# Patient Record
Sex: Male | Born: 1945
Health system: Southern US, Community
[De-identification: ages and names within clinical notes are randomized; demographics above are authoritative.]

## PROBLEM LIST (undated history)

## (undated) DIAGNOSIS — C4491 Basal cell carcinoma of skin, unspecified: Secondary | ICD-10-CM

## (undated) DIAGNOSIS — L509 Urticaria, unspecified: Secondary | ICD-10-CM

## (undated) DIAGNOSIS — H9190 Unspecified hearing loss, unspecified ear: Secondary | ICD-10-CM

## (undated) DIAGNOSIS — M1990 Unspecified osteoarthritis, unspecified site: Secondary | ICD-10-CM

## (undated) DIAGNOSIS — R0683 Snoring: Secondary | ICD-10-CM

## (undated) DIAGNOSIS — C801 Malignant (primary) neoplasm, unspecified: Secondary | ICD-10-CM

## (undated) HISTORY — PX: COLONOSCOPY W/ POLYPECTOMY: SHX1380

## (undated) HISTORY — PX: SKIN CANCER EXCISION: SHX779

---

## 2004-02-27 ENCOUNTER — Ambulatory Visit (HOSPITAL_COMMUNITY): Admission: RE | Admit: 2004-02-27 | Discharge: 2004-02-27 | Payer: Self-pay | Admitting: Gastroenterology

## 2011-05-22 ENCOUNTER — Ambulatory Visit
Admission: RE | Admit: 2011-05-22 | Discharge: 2011-05-22 | Disposition: A | Discharge status: Home / Self Care | Payer: Medicare Other | Source: Ambulatory Visit | Attending: Family Medicine | Admitting: Family Medicine

## 2011-05-22 ENCOUNTER — Other Ambulatory Visit: Payer: Self-pay | Admitting: Family Medicine

## 2011-05-22 DIAGNOSIS — S22009A Unspecified fracture of unspecified thoracic vertebra, initial encounter for closed fracture: Secondary | ICD-10-CM

## 2012-01-07 ENCOUNTER — Other Ambulatory Visit: Payer: Self-pay | Admitting: Dermatology

## 2012-06-27 ENCOUNTER — Ambulatory Visit: Payer: Medicare Other | Attending: Family Medicine

## 2012-06-27 DIAGNOSIS — M25569 Pain in unspecified knee: Secondary | ICD-10-CM | POA: Insufficient documentation

## 2012-06-27 DIAGNOSIS — R5381 Other malaise: Secondary | ICD-10-CM | POA: Insufficient documentation

## 2012-06-27 DIAGNOSIS — M2560 Stiffness of unspecified joint, not elsewhere classified: Secondary | ICD-10-CM | POA: Insufficient documentation

## 2012-06-27 DIAGNOSIS — IMO0001 Reserved for inherently not codable concepts without codable children: Secondary | ICD-10-CM | POA: Insufficient documentation

## 2012-07-04 ENCOUNTER — Ambulatory Visit: Payer: Medicare Other

## 2012-07-11 ENCOUNTER — Ambulatory Visit: Payer: Medicare Other | Admitting: Physical Therapy

## 2012-07-18 ENCOUNTER — Ambulatory Visit: Payer: Medicare Other | Admitting: Physical Therapy

## 2014-03-29 ENCOUNTER — Other Ambulatory Visit: Payer: Self-pay | Admitting: Dermatology

## 2014-08-08 ENCOUNTER — Ambulatory Visit
Admission: RE | Admit: 2014-08-08 | Discharge: 2014-08-08 | Disposition: A | Discharge status: Home / Self Care | Payer: Medicare Other | Source: Ambulatory Visit | Attending: Family Medicine | Admitting: Family Medicine

## 2014-08-08 ENCOUNTER — Other Ambulatory Visit: Payer: Self-pay | Admitting: Family Medicine

## 2014-08-08 DIAGNOSIS — Z01818 Encounter for other preprocedural examination: Secondary | ICD-10-CM

## 2014-10-08 ENCOUNTER — Other Ambulatory Visit: Payer: Self-pay | Admitting: Orthopedic Surgery

## 2014-10-18 ENCOUNTER — Inpatient Hospital Stay (HOSPITAL_COMMUNITY): Admission: RE | Admit: 2014-10-18 | Payer: Medicare Other | Source: Ambulatory Visit

## 2014-10-23 ENCOUNTER — Encounter (HOSPITAL_COMMUNITY): Payer: Self-pay

## 2014-10-23 ENCOUNTER — Encounter (HOSPITAL_COMMUNITY)
Admission: RE | Admit: 2014-10-23 | Discharge: 2014-10-23 | Disposition: A | Discharge status: Home / Self Care | Payer: Medicare Other | Source: Ambulatory Visit | Attending: Orthopedic Surgery | Admitting: Orthopedic Surgery

## 2014-10-23 DIAGNOSIS — Z01812 Encounter for preprocedural laboratory examination: Secondary | ICD-10-CM | POA: Insufficient documentation

## 2014-10-23 HISTORY — DX: Unspecified hearing loss, unspecified ear: H91.90

## 2014-10-23 HISTORY — DX: Unspecified osteoarthritis, unspecified site: M19.90

## 2014-10-23 HISTORY — DX: Urticaria, unspecified: L50.9

## 2014-10-23 HISTORY — DX: Basal cell carcinoma of skin, unspecified: C44.91

## 2014-10-23 HISTORY — DX: Malignant (primary) neoplasm, unspecified: C80.1

## 2014-10-23 HISTORY — DX: Snoring: R06.83

## 2014-10-23 LAB — CBC WITH DIFFERENTIAL/PLATELET
Basophils Absolute: 0 10*3/uL (ref 0.0–0.1)
Basophils Relative: 0 % (ref 0–1)
EOS ABS: 0.1 10*3/uL (ref 0.0–0.7)
Eosinophils Relative: 2 % (ref 0–5)
HCT: 45.1 % (ref 39.0–52.0)
Hemoglobin: 15.1 g/dL (ref 13.0–17.0)
LYMPHS ABS: 2.6 10*3/uL (ref 0.7–4.0)
Lymphocytes Relative: 38 % (ref 12–46)
MCH: 31.4 pg (ref 26.0–34.0)
MCHC: 33.5 g/dL (ref 30.0–36.0)
MCV: 93.8 fL (ref 78.0–100.0)
MONO ABS: 0.4 10*3/uL (ref 0.1–1.0)
MONOS PCT: 6 % (ref 3–12)
Neutro Abs: 3.8 10*3/uL (ref 1.7–7.7)
Neutrophils Relative %: 54 % (ref 43–77)
Platelets: 224 10*3/uL (ref 150–400)
RBC: 4.81 MIL/uL (ref 4.22–5.81)
RDW: 14.1 % (ref 11.5–15.5)
WBC: 6.9 10*3/uL (ref 4.0–10.5)

## 2014-10-23 LAB — COMPREHENSIVE METABOLIC PANEL
ALBUMIN: 4.3 g/dL (ref 3.5–5.2)
ALT: 20 U/L (ref 0–53)
AST: 19 U/L (ref 0–37)
Alkaline Phosphatase: 73 U/L (ref 39–117)
Anion gap: 7 (ref 5–15)
BUN: 11 mg/dL (ref 6–23)
CO2: 28 mmol/L (ref 19–32)
Calcium: 9.1 mg/dL (ref 8.4–10.5)
Chloride: 106 mmol/L (ref 96–112)
Creatinine, Ser: 0.94 mg/dL (ref 0.50–1.35)
GFR calc Af Amer: >90 mL/min (ref >90)
GFR, EST NON AFRICAN AMERICAN: 83 mL/min — AB (ref >90)
Glucose, Bld: 107 mg/dL — ABNORMAL HIGH (ref 70–99)
Potassium: 4.1 mmol/L (ref 3.5–5.1)
SODIUM: 141 mmol/L (ref 135–145)
TOTAL PROTEIN: 7.1 g/dL (ref 6.0–8.3)
Total Bilirubin: 0.9 mg/dL (ref 0.3–1.2)

## 2014-10-23 LAB — URINALYSIS, ROUTINE W REFLEX MICROSCOPIC
Bilirubin Urine: NEGATIVE
Glucose, UA: NEGATIVE mg/dL
Hgb urine dipstick: NEGATIVE
KETONES UR: NEGATIVE mg/dL
LEUKOCYTES UA: NEGATIVE
Nitrite: NEGATIVE
Protein, ur: NEGATIVE mg/dL
SPECIFIC GRAVITY, URINE: 1.007 (ref 1.005–1.030)
Urobilinogen, UA: 0.2 mg/dL (ref 0.0–1.0)
pH: 6.5 (ref 5.0–8.0)

## 2014-10-23 LAB — PROTIME-INR
INR: 1.01 (ref 0.00–1.49)
Prothrombin Time: 13.4 seconds (ref 11.6–15.2)

## 2014-10-23 LAB — SURGICAL PCR SCREEN
MRSA, PCR: NEGATIVE
Staphylococcus aureus: POSITIVE — AB

## 2014-10-23 LAB — APTT: aPTT: 31 seconds (ref 24–37)

## 2014-10-23 NOTE — Progress Notes (Signed)
PCP is Dibas Koirala. Patient denied having any cardiac or pulmonary issues.   Patient informed Nurse that he had a EKG done in January with PCP. Nurse did not see EKG in EPIC. Will request EKG from PCP.

## 2014-10-23 NOTE — Pre-Procedure Instructions (Signed)
JACCOB CZAPLICKI  10/23/2014   Your procedure is scheduled on:  Monday October 29, 2014 at 9:50 AM.  Report to [facility] Admitting at 7:50 AM.  Call this number if you have problems the morning of surgery: [phone]   Remember:   Do not eat food or drink liquids after midnight.   Take these medicines the morning of surgery with A SIP OF WATER: Loratadine (Claritin)   Please stop taking any vitamins, fish oil, herbal medications, Ibuprofen, Advil, Motrin, Alleve, etc   Do not wear jewelry.  Do not wear lotions, powders, or cologne.   Men may shave face and neck.  Do not bring valuables to the hospital.  [facility] is not responsible for any belongings or valuables.               Contacts, dentures or bridgework may not be worn into surgery.  Leave suitcase in the car. After surgery it may be brought to your room.  For patients admitted to the hospital, discharge time is determined by your treatment team.               Patients discharged the day of surgery will not be allowed to drive home.  Name and phone number of your driver:   Special Instructions: Shower using CHG soap the night before and the morning of your surgery   Please read over the following fact sheets that you were given: Pain Booklet, Coughing and Deep Breathing, Total Joint Packet, MRSA Information and Surgical Site Infection Prevention

## 2014-10-24 LAB — URINE CULTURE
COLONY COUNT: NO GROWTH
CULTURE: NO GROWTH

## 2014-10-26 MED ORDER — TRANEXAMIC ACID 100 MG/ML IV SOLN
1000.0000 mg | INTRAVENOUS | Status: DC
  Filled 2014-10-26: qty 10

## 2014-10-26 MED ORDER — BUPIVACAINE LIPOSOME 1.3 % IJ SUSP
20.0000 mL | Freq: Once | INTRAMUSCULAR | Status: AC
  Administered 2014-10-29: 20 mL
  Filled 2014-10-26: qty 20

## 2014-10-28 MED ORDER — CEFAZOLIN SODIUM-DEXTROSE 2-3 GM-% IV SOLR: 2.0000 g | INTRAVENOUS | Status: DC

## 2014-10-29 ENCOUNTER — Encounter (HOSPITAL_COMMUNITY)
Admission: RE | Disposition: A | Discharge status: Home Health | Payer: Self-pay | Source: Ambulatory Visit | Attending: Orthopedic Surgery

## 2014-10-29 ENCOUNTER — Encounter (HOSPITAL_COMMUNITY): Payer: Self-pay

## 2014-10-29 ENCOUNTER — Inpatient Hospital Stay (HOSPITAL_COMMUNITY): Payer: Medicare Other | Admitting: Anesthesiology

## 2014-10-29 ENCOUNTER — Inpatient Hospital Stay (HOSPITAL_COMMUNITY)
Admission: RE | Admit: 2014-10-29 | Discharge: 2014-10-30 | DRG: 470 | Disposition: A | Discharge status: Home Health | Payer: Medicare Other | Source: Ambulatory Visit | Attending: Orthopedic Surgery | Admitting: Orthopedic Surgery

## 2014-10-29 DIAGNOSIS — Z7901 Long term (current) use of anticoagulants: Secondary | ICD-10-CM | POA: Diagnosis not present

## 2014-10-29 DIAGNOSIS — D62 Acute posthemorrhagic anemia: Secondary | ICD-10-CM | POA: Diagnosis not present

## 2014-10-29 DIAGNOSIS — Z881 Allergy status to other antibiotic agents status: Secondary | ICD-10-CM

## 2014-10-29 DIAGNOSIS — M1711 Unilateral primary osteoarthritis, right knee: Secondary | ICD-10-CM | POA: Diagnosis present

## 2014-10-29 DIAGNOSIS — M25561 Pain in right knee: Secondary | ICD-10-CM | POA: Diagnosis present

## 2014-10-29 DIAGNOSIS — Z79899 Other long term (current) drug therapy: Secondary | ICD-10-CM

## 2014-10-29 DIAGNOSIS — Z96659 Presence of unspecified artificial knee joint: Secondary | ICD-10-CM

## 2014-10-29 HISTORY — PX: TOTAL KNEE ARTHROPLASTY: SHX125

## 2014-10-29 LAB — CBC
HEMATOCRIT: 44.6 % (ref 39.0–52.0)
Hemoglobin: 14.7 g/dL (ref 13.0–17.0)
MCH: 30.9 pg (ref 26.0–34.0)
MCHC: 33 g/dL (ref 30.0–36.0)
MCV: 93.9 fL (ref 78.0–100.0)
Platelets: 188 10*3/uL (ref 150–400)
RBC: 4.75 MIL/uL (ref 4.22–5.81)
RDW: 13.9 % (ref 11.5–15.5)
WBC: 14.1 10*3/uL — ABNORMAL HIGH (ref 4.0–10.5)

## 2014-10-29 LAB — CREATININE, SERUM
CREATININE: 0.99 mg/dL (ref 0.50–1.35)
GFR calc Af Amer: >90 mL/min (ref >90)
GFR calc non Af Amer: 81 mL/min — ABNORMAL LOW (ref >90)

## 2014-10-29 SURGERY — ARTHROPLASTY, KNEE, TOTAL
Anesthesia: General | Site: Knee | Laterality: Right

## 2014-10-29 MED ORDER — METHOCARBAMOL 500 MG PO TABS
ORAL_TABLET | ORAL | Status: AC
  Filled 2014-10-29: qty 1

## 2014-10-29 MED ORDER — VANCOMYCIN HCL IN DEXTROSE 1-5 GM/200ML-% IV SOLN
1000.0000 mg | INTRAVENOUS | Status: AC
  Administered 2014-10-29: 1000 mg via INTRAVENOUS
  Filled 2014-10-29 (×2): qty 200

## 2014-10-29 MED ORDER — HYDROMORPHONE HCL 1 MG/ML IJ SOLN
INTRAMUSCULAR | Status: AC
  Filled 2014-10-29: qty 1

## 2014-10-29 MED ORDER — PROPOFOL 10 MG/ML IV BOLUS
INTRAVENOUS | Status: DC | PRN
  Administered 2014-10-29: 200 mg via INTRAVENOUS

## 2014-10-29 MED ORDER — ZOLPIDEM TARTRATE 5 MG PO TABS
5.0000 mg | ORAL_TABLET | Freq: Every evening | ORAL | Status: DC | PRN
Start: 2014-10-29 — End: 2014-10-30

## 2014-10-29 MED ORDER — SODIUM CHLORIDE 0.9 % IR SOLN
Status: DC | PRN
  Administered 2014-10-29: 1000 mL

## 2014-10-29 MED ORDER — LACTATED RINGERS IV SOLN
INTRAVENOUS | Status: DC
  Administered 2014-10-29: 10:00:00 via INTRAVENOUS

## 2014-10-29 MED ORDER — SODIUM CHLORIDE 0.9 % IV SOLN
INTRAVENOUS | Status: DC
  Administered 2014-10-29: 17:00:00 via INTRAVENOUS

## 2014-10-29 MED ORDER — VANCOMYCIN HCL IN DEXTROSE 1-5 GM/200ML-% IV SOLN
1000.0000 mg | Freq: Two times a day (BID) | INTRAVENOUS | Status: AC
  Administered 2014-10-29: 1000 mg via INTRAVENOUS
  Filled 2014-10-29 (×2): qty 200

## 2014-10-29 MED ORDER — METOCLOPRAMIDE HCL 5 MG/ML IJ SOLN: 5.0000 mg | Freq: Three times a day (TID) | INTRAMUSCULAR | Status: DC | PRN

## 2014-10-29 MED ORDER — METHOCARBAMOL 1000 MG/10ML IJ SOLN
500.0000 mg | Freq: Four times a day (QID) | INTRAVENOUS | Status: DC | PRN
  Filled 2014-10-29: qty 5

## 2014-10-29 MED ORDER — BUPIVACAINE-EPINEPHRINE 0.5% -1:200000 IJ SOLN
INTRAMUSCULAR | Status: DC | PRN
  Administered 2014-10-29: 20 mL

## 2014-10-29 MED ORDER — ONDANSETRON HCL 4 MG/2ML IJ SOLN: 4.0000 mg | Freq: Four times a day (QID) | INTRAMUSCULAR | Status: DC | PRN

## 2014-10-29 MED ORDER — SENNOSIDES-DOCUSATE SODIUM 8.6-50 MG PO TABS: 1.0000 | ORAL_TABLET | Freq: Every evening | ORAL | Status: DC | PRN

## 2014-10-29 MED ORDER — SODIUM CHLORIDE 0.9 % IV SOLN: INTRAVENOUS | Status: DC

## 2014-10-29 MED ORDER — CHLORHEXIDINE GLUCONATE 4 % EX LIQD
60.0000 mL | Freq: Once | CUTANEOUS | Status: DC
  Filled 2014-10-29: qty 60

## 2014-10-29 MED ORDER — ROCURONIUM BROMIDE 50 MG/5ML IV SOLN
INTRAVENOUS | Status: AC
  Filled 2014-10-29: qty 1

## 2014-10-29 MED ORDER — FENTANYL CITRATE 0.05 MG/ML IJ SOLN
INTRAMUSCULAR | Status: DC | PRN
  Administered 2014-10-29 (×5): 50 ug via INTRAVENOUS

## 2014-10-29 MED ORDER — DOCUSATE SODIUM 100 MG PO CAPS
100.0000 mg | ORAL_CAPSULE | Freq: Two times a day (BID) | ORAL | Status: DC
  Administered 2014-10-29 – 2014-10-30 (×2): 100 mg via ORAL
  Filled 2014-10-29 (×3): qty 1

## 2014-10-29 MED ORDER — HYDROMORPHONE HCL 1 MG/ML IJ SOLN
1.0000 mg | INTRAMUSCULAR | Status: DC | PRN
  Administered 2014-10-29 – 2014-10-30 (×2): 1 mg via INTRAVENOUS
  Filled 2014-10-29 (×2): qty 1

## 2014-10-29 MED ORDER — TRANEXAMIC ACID 100 MG/ML IV SOLN
2000.0000 mg | INTRAVENOUS | Status: AC
  Administered 2014-10-29: 2000 mg via TOPICAL
  Filled 2014-10-29: qty 20

## 2014-10-29 MED ORDER — PROPOFOL 10 MG/ML IV BOLUS
INTRAVENOUS | Status: AC
  Filled 2014-10-29: qty 20

## 2014-10-29 MED ORDER — ALUM & MAG HYDROXIDE-SIMETH 200-200-20 MG/5ML PO SUSP: 30.0000 mL | ORAL | Status: DC | PRN

## 2014-10-29 MED ORDER — LACTATED RINGERS IV SOLN
INTRAVENOUS | Status: DC | PRN
  Administered 2014-10-29 (×2): via INTRAVENOUS

## 2014-10-29 MED ORDER — LIDOCAINE HCL (CARDIAC) 20 MG/ML IV SOLN
INTRAVENOUS | Status: AC
  Filled 2014-10-29: qty 10

## 2014-10-29 MED ORDER — PHENOL 1.4 % MT LIQD: 1.0000 | OROMUCOSAL | Status: DC | PRN

## 2014-10-29 MED ORDER — FENTANYL CITRATE 0.05 MG/ML IJ SOLN
INTRAMUSCULAR | Status: AC
Start: 2014-10-29 — End: 2014-10-29
  Filled 2014-10-29: qty 5

## 2014-10-29 MED ORDER — LIDOCAINE HCL (CARDIAC) 20 MG/ML IV SOLN
INTRAVENOUS | Status: DC | PRN
  Administered 2014-10-29: 80 mg via INTRAVENOUS

## 2014-10-29 MED ORDER — BISACODYL 5 MG PO TBEC: 5.0000 mg | DELAYED_RELEASE_TABLET | Freq: Every day | ORAL | Status: DC | PRN

## 2014-10-29 MED ORDER — MIDAZOLAM HCL 2 MG/2ML IJ SOLN
INTRAMUSCULAR | Status: AC
  Filled 2014-10-29: qty 2

## 2014-10-29 MED ORDER — ACETAMINOPHEN 650 MG RE SUPP: 650.0000 mg | Freq: Four times a day (QID) | RECTAL | Status: DC | PRN

## 2014-10-29 MED ORDER — METHOCARBAMOL 500 MG PO TABS
500.0000 mg | ORAL_TABLET | Freq: Four times a day (QID) | ORAL | Status: DC | PRN
  Administered 2014-10-29 – 2014-10-30 (×5): 500 mg via ORAL
  Filled 2014-10-29 (×4): qty 1

## 2014-10-29 MED ORDER — OXYCODONE HCL 5 MG PO TABS
5.0000 mg | ORAL_TABLET | ORAL | Status: DC | PRN
  Administered 2014-10-29 – 2014-10-30 (×7): 10 mg via ORAL
  Filled 2014-10-29 (×7): qty 2

## 2014-10-29 MED ORDER — FENTANYL CITRATE 0.05 MG/ML IJ SOLN
INTRAMUSCULAR | Status: AC
  Filled 2014-10-29: qty 2

## 2014-10-29 MED ORDER — ENOXAPARIN SODIUM 30 MG/0.3ML ~~LOC~~ SOLN
30.0000 mg | Freq: Two times a day (BID) | SUBCUTANEOUS | Status: DC
  Administered 2014-10-30: 30 mg via SUBCUTANEOUS
  Filled 2014-10-29: qty 0.3

## 2014-10-29 MED ORDER — CELECOXIB 200 MG PO CAPS
200.0000 mg | ORAL_CAPSULE | Freq: Two times a day (BID) | ORAL | Status: DC
  Administered 2014-10-29 – 2014-10-30 (×2): 200 mg via ORAL
  Filled 2014-10-29 (×3): qty 1

## 2014-10-29 MED ORDER — MEPERIDINE HCL 25 MG/ML IJ SOLN
6.2500 mg | INTRAMUSCULAR | Status: DC | PRN
  Administered 2014-10-29: 12.5 mg via INTRAVENOUS

## 2014-10-29 MED ORDER — METOCLOPRAMIDE HCL 5 MG PO TABS: 5.0000 mg | ORAL_TABLET | Freq: Three times a day (TID) | ORAL | Status: DC | PRN

## 2014-10-29 MED ORDER — DIPHENHYDRAMINE HCL 12.5 MG/5ML PO ELIX: 12.5000 mg | ORAL_SOLUTION | ORAL | Status: DC | PRN

## 2014-10-29 MED ORDER — HYDROMORPHONE HCL 1 MG/ML IJ SOLN
0.2500 mg | INTRAMUSCULAR | Status: DC | PRN
  Administered 2014-10-29 (×4): 0.5 mg via INTRAVENOUS

## 2014-10-29 MED ORDER — LORATADINE 10 MG PO TABS
10.0000 mg | ORAL_TABLET | Freq: Two times a day (BID) | ORAL | Status: DC
  Administered 2014-10-29 – 2014-10-30 (×2): 10 mg via ORAL
  Filled 2014-10-29 (×3): qty 1

## 2014-10-29 MED ORDER — OXYCODONE HCL ER 10 MG PO T12A
10.0000 mg | EXTENDED_RELEASE_TABLET | Freq: Two times a day (BID) | ORAL | Status: DC
  Administered 2014-10-29 – 2014-10-30 (×2): 10 mg via ORAL
  Filled 2014-10-29 (×3): qty 1

## 2014-10-29 MED ORDER — MIDAZOLAM HCL 5 MG/5ML IJ SOLN
INTRAMUSCULAR | Status: DC | PRN
  Administered 2014-10-29: 2 mg via INTRAVENOUS

## 2014-10-29 MED ORDER — ONDANSETRON HCL 4 MG/2ML IJ SOLN: 4.0000 mg | Freq: Once | INTRAMUSCULAR | Status: DC | PRN

## 2014-10-29 MED ORDER — ACETAMINOPHEN 325 MG PO TABS: 650.0000 mg | ORAL_TABLET | Freq: Four times a day (QID) | ORAL | Status: DC | PRN

## 2014-10-29 MED ORDER — MENTHOL 3 MG MT LOZG: 1.0000 | LOZENGE | OROMUCOSAL | Status: DC | PRN

## 2014-10-29 MED ORDER — MEPERIDINE HCL 25 MG/ML IJ SOLN
INTRAMUSCULAR | Status: AC
  Filled 2014-10-29: qty 1

## 2014-10-29 MED ORDER — ONDANSETRON HCL 4 MG/2ML IJ SOLN
INTRAMUSCULAR | Status: DC | PRN
  Administered 2014-10-29: 4 mg via INTRAVENOUS

## 2014-10-29 MED ORDER — ONDANSETRON HCL 4 MG PO TABS: 4.0000 mg | ORAL_TABLET | Freq: Four times a day (QID) | ORAL | Status: DC | PRN

## 2014-10-29 MED ORDER — FLEET ENEMA 7-19 GM/118ML RE ENEM: 1.0000 | ENEMA | Freq: Once | RECTAL | Status: AC | PRN

## 2014-10-29 SURGICAL SUPPLY — 59 items
BANDAGE ELASTIC 6 VELCRO ST LF (GAUZE/BANDAGES/DRESSINGS) ×3 IMPLANT
BANDAGE ESMARK 6X9 LF (GAUZE/BANDAGES/DRESSINGS) ×1 IMPLANT
BLADE SAGITTAL 13X1.27X60 (BLADE) ×2 IMPLANT
BLADE SAGITTAL 13X1.27X60MM (BLADE) ×1
BLADE SAW SGTL 83.5X18.5 (BLADE) ×3 IMPLANT
BLADE SURG 10 STRL SS (BLADE) ×3 IMPLANT
BNDG ESMARK 6X9 LF (GAUZE/BANDAGES/DRESSINGS) ×3
BOWL SMART MIX CTS (DISPOSABLE) ×3 IMPLANT
CAPT KNEE TOTAL 3 ×3 IMPLANT
CEMENT BONE SIMPLEX SPEEDSET (Cement) ×6 IMPLANT
COVER SURGICAL LIGHT HANDLE (MISCELLANEOUS) ×3 IMPLANT
CUFF TOURNIQUET SINGLE 34IN LL (TOURNIQUET CUFF) ×3 IMPLANT
DRAPE EXTREMITY T 121X128X90 (DRAPE) ×3 IMPLANT
DRAPE IMP U-DRAPE 54X76 (DRAPES) ×3 IMPLANT
DRAPE INCISE IOBAN 66X45 STRL (DRAPES) ×6 IMPLANT
DRAPE PROXIMA HALF (DRAPES) IMPLANT
DRAPE U-SHAPE 47X51 STRL (DRAPES) ×3 IMPLANT
DRSG ADAPTIC 3X8 NADH LF (GAUZE/BANDAGES/DRESSINGS) ×3 IMPLANT
DRSG PAD ABDOMINAL 8X10 ST (GAUZE/BANDAGES/DRESSINGS) ×3 IMPLANT
DURAPREP 26ML APPLICATOR (WOUND CARE) ×6 IMPLANT
ELECT REM PT RETURN 9FT ADLT (ELECTROSURGICAL) ×3
ELECTRODE REM PT RTRN 9FT ADLT (ELECTROSURGICAL) ×1 IMPLANT
GAUZE SPONGE 4X4 12PLY STRL (GAUZE/BANDAGES/DRESSINGS) ×3 IMPLANT
GLOVE BIOGEL M 7.0 STRL (GLOVE) IMPLANT
GLOVE BIOGEL PI IND STRL 7.5 (GLOVE) IMPLANT
GLOVE BIOGEL PI IND STRL 8.5 (GLOVE) ×2 IMPLANT
GLOVE BIOGEL PI INDICATOR 7.5 (GLOVE)
GLOVE BIOGEL PI INDICATOR 8.5 (GLOVE) ×4
GLOVE SURG ORTHO 8.0 STRL STRW (GLOVE) ×6 IMPLANT
GOWN STRL REUS W/ TWL LRG LVL3 (GOWN DISPOSABLE) ×1 IMPLANT
GOWN STRL REUS W/ TWL XL LVL3 (GOWN DISPOSABLE) ×2 IMPLANT
GOWN STRL REUS W/TWL LRG LVL3 (GOWN DISPOSABLE) ×2
GOWN STRL REUS W/TWL XL LVL3 (GOWN DISPOSABLE) ×4
HANDPIECE INTERPULSE COAX TIP (DISPOSABLE) ×2
HOOD PEEL AWAY FACE SHEILD DIS (HOOD) ×9 IMPLANT
KIT BASIN OR (CUSTOM PROCEDURE TRAY) ×3 IMPLANT
KIT ROOM TURNOVER OR (KITS) ×3 IMPLANT
KNEE CAPITATED TOTAL 3 ×1 IMPLANT
MANIFOLD NEPTUNE II (INSTRUMENTS) ×3 IMPLANT
NEEDLE 22X1 1/2 (OR ONLY) (NEEDLE) ×6 IMPLANT
NS IRRIG 1000ML POUR BTL (IV SOLUTION) ×3 IMPLANT
PACK TOTAL JOINT (CUSTOM PROCEDURE TRAY) ×3 IMPLANT
PACK UNIVERSAL I (CUSTOM PROCEDURE TRAY) ×3 IMPLANT
PAD ARMBOARD 7.5X6 YLW CONV (MISCELLANEOUS) ×6 IMPLANT
PADDING CAST COTTON 6X4 STRL (CAST SUPPLIES) ×3 IMPLANT
SET HNDPC FAN SPRY TIP SCT (DISPOSABLE) ×1 IMPLANT
SPONGE GAUZE 4X4 12PLY STER LF (GAUZE/BANDAGES/DRESSINGS) ×3 IMPLANT
STAPLER VISISTAT 35W (STAPLE) ×3 IMPLANT
SUCTION FRAZIER TIP 10 FR DISP (SUCTIONS) ×3 IMPLANT
SUT BONE WAX W31G (SUTURE) ×3 IMPLANT
SUT VIC AB 0 CTB1 27 (SUTURE) ×6 IMPLANT
SUT VIC AB 1 CT1 27 (SUTURE) ×4
SUT VIC AB 1 CT1 27XBRD ANBCTR (SUTURE) ×2 IMPLANT
SUT VIC AB 2-0 CT1 27 (SUTURE) ×4
SUT VIC AB 2-0 CT1 TAPERPNT 27 (SUTURE) ×2 IMPLANT
SYR 20CC LL (SYRINGE) ×6 IMPLANT
TOWEL OR 17X24 6PK STRL BLUE (TOWEL DISPOSABLE) ×3 IMPLANT
TOWEL OR 17X26 10 PK STRL BLUE (TOWEL DISPOSABLE) ×3 IMPLANT
WATER STERILE IRR 1000ML POUR (IV SOLUTION) ×6 IMPLANT

## 2014-10-29 NOTE — Progress Notes (Signed)
Utilization review completed.  

## 2014-10-29 NOTE — Progress Notes (Signed)
Call to Dr. Ronnie Derby, requested review of preop antibiotic due to allergies. Call to Pharmacy, spoke with Crystal, R.Ph, order clarified for Vancomycin.

## 2014-10-29 NOTE — Op Note (Signed)
TOTAL KNEE REPLACEMENT OPERATIVE NOTE:  10/29/2014  1:37 PM  PATIENT:  Angel Boyd  69 y.o. male  PRE-OPERATIVE DIAGNOSIS:  primary osteoarthritis right knee  POST-OPERATIVE DIAGNOSIS:  primary osteoarthritis right knee  PROCEDURE:  Procedure(s): TOTAL KNEE ARTHROPLASTY  SURGEON:  Surgeon(s): Vickey Huger, MD  PHYSICIAN ASSISTANT: [name], [facility]  ANESTHESIA:   general  DRAINS: Hemovac  SPECIMEN: None  COUNTS:  Correct  TOURNIQUET:   Total Tourniquet Time Documented: Thigh (Right) - -6 minutes Total: Thigh (Right) - -6 minutes   DICTATION:  Indication for procedure:    The patient is a 69 y.o. male who has failed conservative treatment for primary osteoarthritis right knee.  Informed consent was obtained prior to anesthesia. The risks versus benefits of the operation were explain and in a way the patient can, and did, understand.   On the implant demand matching protocol, this patient scored 10.  Therefore, this patient did" "did not receive a polyethylene insert with vitamin E which is a high demand implant.  Description of procedure:     The patient was taken to the operating room and placed under anesthesia.  The patient was positioned in the usual fashion taking care that all body parts were adequately padded and/or protected.  I foley catheter was not placed.  A tourniquet was applied and the leg prepped and draped in the usual sterile fashion.  The extremity was exsanguinated with the esmarch and tourniquet inflated to 350 mmHg.  Pre-operative range of motion was normal.  The knee was in 5 degree of mild varus.  A midline incision approximately 6-7 inches long was made with a #10 blade.  A new blade was used to make a parapatellar arthrotomy going 2-3 cm into the quadriceps tendon, over the patella, and alongside the medial aspect of the patellar tendon.  A synovectomy was then performed with the #10 blade and forceps. I then elevated the deep MCL off the medial  tibial metaphysis subperiosteally around to the semimembranosus attachment.    I everted the patella and used calipers to measure patellar thickness.  I used the reamer to ream down to appropriate thickness to recreate the native thickness.  I then removed excess bone with the rongeur and sagittal saw.  I used the appropriately sized template and drilled the three lug holes.  I then put the trial in place and measured the thickness with the calipers to ensure recreation of the native thickness.  The trial was then removed and the patella subluxed and the knee brought into flexion.  A homan retractor was place to retract and protect the patella and lateral structures.  A Z-retractor was place medially to protect the medial structures.  The extra-medullary alignment system was used to make cut the tibial articular surface perpendicular to the anamotic axis of the tibia and in 3 degrees of posterior slope.  The cut surface and alignment jig was removed.  I then used the intramedullary alignment guide to make a 3 valgus cut on the distal femur.  I then marked out the epicondylar axis on the distal femur.  The posterior condylar axis measured 3 degrees.  I then used the anterior referencing sizer and measured the femur to be a size 10.  The 4-In-1 cutting block was screwed into place in external rotation matching the posterior condylar angle, making our cuts perpendicular to the epicondylar axis.  Anterior, posterior and chamfer cuts were made with the sagittal saw.  The cutting block and cut pieces  were removed.  A lamina spreader was placed in 90 degrees of flexion.  The ACL, PCL, menisci, and posterior condylar osteophytes were removed.  A 12 mm spacer blocked was found to offer good flexion and extension gap balance after minimal in degree releasing.   The scoop retractor was then placed and the femoral finishing block was pinned in place.  The small sagittal saw was used as well as the lug drill to finish  the femur.  The block and cut surfaces were removed and the medullary canal hole filled with autograft bone from the cut pieces.  The tibia was delivered forward in deep flexion and external rotation.  A size F tray was selected and pinned into place centered on the medial 1/3 of the tibial tubercle.  The reamer and keel was used to prepare the tibia through the tray.    I then trialed with the size 10 femur, size F tibia, a 12 mm insert and the 35 patella.  I had excellent flexion/extension gap balance, excellent patella tracking.  Flexion was full and beyond 120 degrees; extension was zero.  These components were chosen and the staff opened them to me on the back table while the knee was lavaged copiously and the cement mixed.  The soft tissue was infiltrated with 60cc of exparel 1.3% through a 21 gauge needle.  I cemented in the components and removed all excess cement.  The polyethylene tibial component was snapped into place and the knee placed in extension while cement was hardening.  The capsule was infilltrated with 30cc of .25% Marcaine with epinephrine.  A hemovac was place in the joint exiting superolaterally.  A pain pump was place superomedially superficial to the arthrotomy.  Once the cement was hard, the tourniquet was let down.  Hemostasis was obtained.  The arthrotomy was closed with figure-8 #1 vicryl sutures.  The deep soft tissues were closed with #0 vicryls and the subcuticular layer closed with a running #2-0 vicryl.  The skin was reapproximated and closed with skin staples.  The wound was dressed with xeroform, 4 x4's, 2 ABD sponges, a single layer of webril and a TED stocking.   The patient was then awakened, extubated, and taken to the recovery room in stable condition.  BLOOD LOSS:  300cc DRAINS: 1 hemovac, 1 pain catheter COMPLICATIONS:  None.  PLAN OF CARE: Admit to inpatient   PATIENT DISPOSITION:  PACU - hemodynamically stable.   Delay start of Pharmacological VTE  agent (>24hrs) due to surgical blood loss or risk of bleeding:  not applicable  Please fax a copy of this op note to my office at [phone] (please only include page 1 and 2 of the Case Information op note)

## 2014-10-29 NOTE — H&P (Signed)
  Angel Boyd MRN:  [id number] DOB/SEX:  Oct 27, 1945/male  CHIEF COMPLAINT:  Painful right Knee  HISTORY: Patient is a 69 y.o. male presented with a history of pain in the right knee. Onset of symptoms was gradual starting several years ago with gradually worsening course since that time. Prior procedures on the knee include arthroscopy. Patient has been treated conservatively with over-the-counter NSAIDs and activity modification. Patient currently rates pain in the knee at 10 out of 10 with activity. There is pain at night.  PAST MEDICAL HISTORY: There are no active problems to display for this patient.  Past Medical History  Diagnosis Date  . Arthritis   . Cancer     skin cancer removed  . Urticaria   . Basal cell carcinoma   . Hearing loss   . Snoring    Past Surgical History  Procedure Laterality Date  . Skin cancer excision      neck and head  . Colonoscopy w/ polypectomy       MEDICATIONS:   No prescriptions prior to admission    ALLERGIES:   Allergies  Allergen Reactions  . Ampicillin Hives  . Azithromycin Hives, Itching and Swelling    REVIEW OF SYSTEMS:  A comprehensive review of systems was negative.   FAMILY HISTORY:  No family history on file.  SOCIAL HISTORY:   History  Substance Use Topics  . Smoking status: Never Smoker   . Smokeless tobacco: Not on file  . Alcohol Use: Yes     Comment: occasional     EXAMINATION:  Vital signs in last 24 hours:    General appearance: alert, cooperative and no distress Lungs: clear to auscultation bilaterally Heart: regular rate and rhythm, S1, S2 normal, no murmur, click, rub or gallop Abdomen: soft, non-tender; bowel sounds normal; no masses,  no organomegaly Extremities: extremities normal, atraumatic, no cyanosis or edema and Homans sign is negative, no sign of DVT Pulses: 2+ and symmetric Skin: Skin color, texture, turgor normal. No rashes or lesions Lymph nodes: Cervical, supraclavicular, and  axillary nodes normal.  Musculoskeletal:  ROM 0-110, Ligaments intact,  Imaging Review Plain radiographs demonstrate severe degenerative joint disease of the right knee. The overall alignment is significant varus. The bone quality appears to be good for age and reported activity level.  Assessment/Plan: Primary osteoarthritis, right knee   The patient history, physical examination and imaging studies are consistent with advanced degenerative joint disease of the right knee. The patient has failed conservative treatment.  The clearance notes were reviewed.  After discussion with the patient it was felt that Total Knee Replacement was indicated. The procedure,  risks, and benefits of total knee arthroplasty were presented and reviewed. The risks including but not limited to aseptic loosening, infection, blood clots, vascular injury, stiffness, patella tracking problems complications among others were discussed. The patient acknowledged the explanation, agreed to proceed with the plan.  Angel Boyd 10/29/2014, 6:46 AM

## 2014-10-29 NOTE — Transfer of Care (Signed)
Immediate Anesthesia Transfer of Care Note  Patient: Angel Boyd  Procedure(s) Performed: Procedure(s): TOTAL KNEE ARTHROPLASTY (Right)  Patient Location: PACU  Anesthesia Type:General  Level of Consciousness: awake and alert   Airway & Oxygen Therapy: Patient Spontanous Breathing and Patient connected to nasal cannula oxygen  Post-op Assessment: Report given to RN and Post -op Vital signs reviewed and stable  Post vital signs: stable  Last Vitals:  Filed Vitals:   10/29/14 1214  BP:   Pulse:   Temp: 36.5 C  Resp:     Complications: No apparent anesthesia complications

## 2014-10-29 NOTE — Progress Notes (Signed)
Orthopedic Tech Progress Note Patient Details:  Angel Boyd [birth year] [id number]  CPM Right Knee CPM Right Knee: On Right Knee Flexion (Degrees): 90 Right Knee Extension (Degrees): 0 Additional Comments: trapeze bar patient helper Viewed order from doctor's order list; ortho currently has no footsie roll but will provide it for pt when obtained  Hildred Priest 10/29/2014, 1:35 PM

## 2014-10-29 NOTE — Anesthesia Postprocedure Evaluation (Signed)
  Anesthesia Post-op Note  Patient: Angel Boyd  Procedure(s) Performed: Procedure(s): TOTAL KNEE ARTHROPLASTY (Right)  Patient Location: PACU  Anesthesia Type: No value filed.   Level of Consciousness: awake, alert  and oriented  Airway and Oxygen Therapy: Patient Spontanous Breathing  Post-op Pain: mild  Post-op Assessment: Post-op Vital signs reviewed  Post-op Vital Signs: Reviewed  Last Vitals:  Filed Vitals:   10/29/14 1400  BP: 145/72  Pulse: 60  Temp:   Resp: 13    Complications: No apparent anesthesia complications

## 2014-10-29 NOTE — Evaluation (Signed)
Physical Therapy Evaluation Patient Details Name: Angel Boyd MRN: [id number] DOB: 03-20-1946 Today's Date: 10/29/2014   History of Present Illness  69 y.o. male admitted to [facility] on 10/29/14 for elective R TKA.  Pt with no significant PMHx.   Clinical Impression  Pt is POD #0 and is moving well, although he is self limiting his WB through his right foot and essentially walked in room with TDWB R.  He may benefit from doing a round of knee presses and exercises before he gets up out of bed next time to decrease stiffness and increase extension in standing.  I anticipate he will do well enough to return home with wife's supervision and HHPT at discharge.   PT to follow acutely for deficits listed below.       Follow Up Recommendations Home health PT;Supervision for mobility/OOB    Equipment Recommendations  None recommended by PT    Recommendations for Other Services   NA    Precautions / Restrictions Precautions Precautions: Knee Precaution Booklet Issued: Yes (comment) Precaution Comments: knee handout given, three exercises reviewed Restrictions Weight Bearing Restrictions: Yes RLE Weight Bearing: Weight bearing as tolerated      Mobility  Bed Mobility Overal bed mobility: Needs Assistance Bed Mobility: Supine to Sit     Supine to sit: Min assist     General bed mobility comments: Min assist to help progress right leg over EOB.  Verbal cues for sequencing.   Transfers Overall transfer level: Needs assistance Equipment used: Rolling walker (2 wheeled) Transfers: Sit to/from Stand Sit to Stand: Min assist         General transfer comment: Min assist to support trunk during transition to stand.   Ambulation/Gait Ambulation/Gait assistance: Min guard Ambulation Distance (Feet): 8 Feet Assistive device: Rolling walker (2 wheeled) Gait Pattern/deviations: Step-to pattern;Antalgic     General Gait Details: Pt with flexed right knee, standing on toes, not wanting to  put any weight through his right leg.  Min assist for safety and balance due to almost hop to gait pattern.                Pertinent Vitals/Pain Pain Assessment: 0-10 Pain Score: 5  Pain Location: right knee Pain Descriptors / Indicators: Aching;Burning Pain Intervention(s): Limited activity within patient's tolerance;Monitored during session;Repositioned    Home Living Family/patient expects to be discharged to:: Private residence Living Arrangements: Spouse/significant other Available Help at Discharge: Family;Available 24 hours/day (wife is retired) Type of Home: House Home Access: Stairs to enter Entrance Stairs-Rails: Horticulturist, commercial of Steps: 2 Home Layout: Two level Home Equipment: Environmental consultant - 2 wheels;Cane - quad;Cane - single point;Bedside commode;Shower seat      Prior Function Level of Independence: Independent with assistive device(s)         Comments: on occation he used cane, mostly limped.       Hand Dominance   Dominant Hand: Right    Extremity/Trunk Assessment   Upper Extremity Assessment: Overall WFL for tasks assessed           Lower Extremity Assessment: RLE deficits/detail RLE Deficits / Details: right leg with normal post op pain and weakness.  Pt with significant flexion moment at the knee.  Better after prefomring quad sets in recliner chair.  ankle 3/5, knee 2/5, hip 2+/5    Cervical / Trunk Assessment: Normal  Communication   Communication: No difficulties  Cognition Arousal/Alertness: Awake/alert Behavior During Therapy: WFL for tasks assessed/performed Overall Cognitive Status: Within Functional Limits  for tasks assessed                      General Comments      Exercises Total Joint Exercises Ankle Circles/Pumps: AROM;Both;10 reps;Supine Quad Sets: AROM;Right;10 reps;Supine Heel Slides: AAROM;Right;10 reps;Supine      Assessment/Plan    PT Assessment Patient needs continued PT services  PT  Diagnosis Difficulty walking;Abnormality of gait;Generalized weakness;Acute pain   PT Problem List Decreased strength;Decreased range of motion;Decreased activity tolerance;Decreased balance;Decreased mobility;Decreased knowledge of use of DME;Decreased knowledge of precautions;Pain  PT Treatment Interventions DME instruction;Gait training;Stair training;Functional mobility training;Therapeutic activities;Therapeutic exercise;Balance training;Neuromuscular re-education;Patient/family education;Manual techniques;Modalities   PT Goals (Current goals can be found in the Care Plan section) Acute Rehab PT Goals Patient Stated Goal: to go home tomorrow PT Goal Formulation: With patient Time For Goal Achievement: 11/05/14 Potential to Achieve Goals: Good    Frequency 7X/week    End of Session   Activity Tolerance: Patient limited by pain Patient left: in chair;with call bell/phone within reach;with family/visitor present Nurse Communication: Mobility status         Time: 1711-1736 PT Time Calculation (min) (ACUTE ONLY): 25 min   Charges:   PT Evaluation $Initial PT Evaluation Tier I: 1 Procedure PT Treatments $Therapeutic Activity: 8-22 mins        Kashana Breach B. [place], [place], DPT [phone]   10/29/2014, 5:45 PM

## 2014-10-29 NOTE — Anesthesia Procedure Notes (Addendum)
Anesthesia Regional Block:  Adductor canal block  Pre-Anesthetic Checklist: ,, timeout performed, Correct Patient, Correct Site, Correct Laterality, Correct Procedure, Correct Position, site marked, Risks and benefits discussed,  Surgical consent,  Pre-op evaluation,  At surgeon's request and post-op pain management  Laterality: Right  Prep: Maximum Sterile Barrier Precautions used, chloraprep and alcohol swabs       Needles:  Injection technique: Single-shot  Needle Type: Stimulator Needle - 80          Additional Needles:  Procedures: Doppler guided Adductor canal block Narrative:  Start time: 10/29/2014 9:55 AM End time: 10/29/2014 10:00 AM Injection made incrementally with aspirations every 5 mL.  Performed by: Personally  Anesthesiologist: Kate Sable  Additional Notes: Pt accepts procedure w/ risks. 20cc 0.%5 Marcaine w/ epi w/o difficulty or discomfort. GES   Procedure Name: Intubation Date/Time: 10/29/2014 10:20 AM Performed by: Manus Gunning, Keante Urizar J Pre-anesthesia Checklist: Patient identified, Suction available, Emergency Drugs available, Patient being monitored and Timeout performed Patient Re-evaluated:Patient Re-evaluated prior to inductionOxygen Delivery Method: Circle system utilized Preoxygenation: Pre-oxygenation with 100% oxygen Intubation Type: IV induction Ventilation: Mask ventilation without difficulty LMA: LMA inserted LMA Size: 4.0 Number of attempts: 1 Placement Confirmation: positive ETCO2,  CO2 detector and breath sounds checked- equal and bilateral Dental Injury: Teeth and Oropharynx as per pre-operative assessment

## 2014-10-29 NOTE — Anesthesia Preprocedure Evaluation (Addendum)
Anesthesia Evaluation  Patient identified by MRN, date of birth, ID band Patient awake    Reviewed: Allergy & Precautions, NPO status , Patient's Chart, lab work & pertinent test results  Airway Mallampati: I  TM Distance: >3 FB     Dental  (+) Teeth Intact, Dental Advisory Given   Pulmonary  breath sounds clear to auscultation        Cardiovascular Rhythm:Regular Rate:Normal     Neuro/Psych    GI/Hepatic   Endo/Other    Renal/GU      Musculoskeletal  (+) Arthritis -,   Abdominal   Peds  Hematology   Anesthesia Other Findings   Reproductive/Obstetrics                            Anesthesia Physical Anesthesia Plan  ASA: II  Anesthesia Plan: General   Post-op Pain Management:    Induction: Intravenous  Airway Management Planned: LMA  Additional Equipment:   Intra-op Plan:   Post-operative Plan: Extubation in OR  Informed Consent: I have reviewed the patients History and Physical, chart, labs and discussed the procedure including the risks, benefits and alternatives for the proposed anesthesia with the patient or authorized representative who has indicated his/her understanding and acceptance.   Dental advisory given  Plan Discussed with: CRNA, Anesthesiologist and Surgeon  Anesthesia Plan Comments:         Anesthesia Quick Evaluation

## 2014-10-30 ENCOUNTER — Encounter (HOSPITAL_COMMUNITY): Payer: Self-pay | Admitting: Orthopedic Surgery

## 2014-10-30 LAB — CBC
HCT: 39.4 % (ref 39.0–52.0)
Hemoglobin: 13 g/dL (ref 13.0–17.0)
MCH: 30.7 pg (ref 26.0–34.0)
MCHC: 33 g/dL (ref 30.0–36.0)
MCV: 93.1 fL (ref 78.0–100.0)
Platelets: 181 10*3/uL (ref 150–400)
RBC: 4.23 MIL/uL (ref 4.22–5.81)
RDW: 14 % (ref 11.5–15.5)
WBC: 8.7 10*3/uL (ref 4.0–10.5)

## 2014-10-30 LAB — BASIC METABOLIC PANEL
ANION GAP: 5 (ref 5–15)
BUN: 9 mg/dL (ref 6–23)
CALCIUM: 8.4 mg/dL (ref 8.4–10.5)
CHLORIDE: 100 mmol/L (ref 96–112)
CO2: 29 mmol/L (ref 19–32)
CREATININE: 0.94 mg/dL (ref 0.50–1.35)
GFR calc Af Amer: >90 mL/min (ref >90)
GFR, EST NON AFRICAN AMERICAN: 83 mL/min — AB (ref >90)
Glucose, Bld: 122 mg/dL — ABNORMAL HIGH (ref 70–99)
Potassium: 4.2 mmol/L (ref 3.5–5.1)
Sodium: 134 mmol/L — ABNORMAL LOW (ref 135–145)

## 2014-10-30 MED ORDER — TIZANIDINE HCL 2 MG PO TABS: 2.0000 mg | ORAL_TABLET | Freq: Four times a day (QID) | ORAL | Status: DC | PRN

## 2014-10-30 MED ORDER — OXYCODONE HCL ER 20 MG PO T12A: 20.0000 mg | EXTENDED_RELEASE_TABLET | Freq: Two times a day (BID) | ORAL | Status: DC

## 2014-10-30 MED ORDER — IBUPROFEN 800 MG PO TABS: 800.0000 mg | ORAL_TABLET | Freq: Three times a day (TID) | ORAL | Status: DC

## 2014-10-30 MED ORDER — ENOXAPARIN SODIUM 40 MG/0.4ML ~~LOC~~ SOLN: 40.0000 mg | SUBCUTANEOUS | Status: DC

## 2014-10-30 MED ORDER — OXYCODONE HCL 5 MG PO TABS: 5.0000 mg | ORAL_TABLET | ORAL | Status: DC | PRN

## 2014-10-30 NOTE — Care Management Note (Deleted)
CARE MANAGEMENT NOTE 10/30/2014  Patient:  Angel Boyd   Account Number:  [id number]  Date Initiated:  10/30/2014  Documentation initiated by:  Ricki Miller  Subjective/Objective Assessment:   69 yr old male admitted with Left hip osteoarthrits protrusion. patient underwent a left total hip arthroplasty.     Action/Plan:   Case manager spoke with patient and wife concerning home health and DME needs. Patient preoperatively setup with [facility], no changes.  They have a 3in1.   Anticipated DC Date:  11/01/2014   Anticipated DC Plan:  [place] Planning Services  CM consult      [facility] Choice  HOME HEALTH  DURABLE MEDICAL EQUIPMENT   Choice offered to / List presented to:  [name]   DME arranged  Vassie Moselle      DME agency  [place] arranged  [place]   Status of service:  Completed, signed off Medicare Important Message given?   (If response is "NO", the following Medicare IM given date fields will be blank) Date Medicare IM given:   Medicare IM given by:   Date Additional Medicare IM given:   Additional Medicare IM given by:    Discharge Disposition:  [place]  Per UR Regulation:  Reviewed for med. necessity/level of care/duration of stay

## 2014-10-30 NOTE — Care Management Note (Addendum)
CARE MANAGEMENT NOTE 10/30/2014  Patient:  Angel Boyd, Angel Boyd   Account Number:  [id number]  Date Initiated:  10/29/2014  Documentation initiated by:  Ricki Miller  Subjective/Objective Assessment:   69 yr old male admitted with osteoarthritis of the left knee. Patient underwent a left total knee arthroplasty     Action/Plan:   PT/OT eval.  Case manager spoke with patient and wife concerning home health and DME  .Marland Kitchen patient was preoperatively setup with [facility], no changes. has support at discharge. Patient has 3in1.   Anticipated DC Date:  10/31/2014   Anticipated DC Plan:  [place]  CM consult      PAC Choice  [place]   Choice offered to / List presented to:  [name]   DME arranged  [facility]  [facility]      DME agency  [employer]     [place] arranged  HH-2 PT      [place]   Status of service:  Completed, signed off Medicare Important Message given?   (If response is "NO", the following Medicare IM given date fields will be blank) Date Medicare IM given:   Medicare IM given by:   Date Additional Medicare IM given:   Additional Medicare IM given by:    Discharge Disposition:  [place]  Per UR Regulation:  Reviewed for med. necessity/level of care/duration of stay

## 2014-10-30 NOTE — Progress Notes (Signed)
SPORTS MEDICINE AND JOINT REPLACEMENT  Angel Mulch, MD   Angel Spry, PA-C Angel Boyd, Angel Boyd, Angel Boyd  [id number]                             [phone]   PROGRESS NOTE  Subjective:  negative for Chest Pain  negative for Shortness of Breath  negative for Nausea/Vomiting   negative for Calf Pain  negative for Bowel Movement   Tolerating Diet: yes         Patient reports pain as 6 on 0-10 scale.    Objective: Vital signs in last 24 hours:   Patient Vitals for the past 24 hrs:  BP Temp Pulse Resp SpO2  10/30/14 1252 137/64 mmHg 98.4 F (36.9 C) 65 18 100 %  10/30/14 0501 123/63 mmHg 98.3 F (36.8 C) 64 16 100 %  10/30/14 0337 - - - 16 100 %  10/30/14 0037 127/67 mmHg 97.6 F (36.4 C) 64 18 99 %  10/29/14 2313 - - - 18 98 %  10/29/14 2040 (!) 147/66 mmHg 98.8 F (37.1 C) 68 16 100 %  10/29/14 2000 - - - 16 99 %  10/29/14 1600 - - - 16 97 %    @flow {1959:LAST@   Intake/Output from previous day:   04/11 0701 - 04/12 0700 In: 2006.3 [P.O.:660; I.V.:1346.3] Out: 50    Intake/Output this shift:   04/12 0701 - 04/12 1900 In: 480 [P.O.:480] Out: -    Intake/Output      04/11 0701 - 04/12 0700 04/12 0701 - 04/13 0700   P.O. 660 480   I.V. (mL/kg) 1346.3 (13.8)    Total Intake(mL/kg) 2006.3 (20.6) 480 (4.9)   Blood 50    Total Output 50     Net +1956.3 +480           LABORATORY DATA:  Recent Labs  10/29/14 1720 10/30/14 0600  WBC 14.1* 8.7  HGB 14.7 13.0  HCT 44.6 39.4  PLT 188 181    Recent Labs  10/29/14 1720 10/30/14 0600  NA  --  134*  K  --  4.2  CL  --  100  CO2  --  29  BUN  --  9  CREATININE 0.99 0.94  GLUCOSE  --  122*  CALCIUM  --  8.4   Lab Results  Component Value Date   INR 1.01 10/23/2014    Examination:  General appearance: alert, cooperative and no distress Extremities: Homans sign is negative, no sign of DVT  Wound Exam: clean, dry, intact   Drainage:  Scant/small amount Serosanguinous exudate  Motor  Exam: EHL and FHL Intact  Sensory Exam: Deep Peroneal normal   Assessment:    1 Day Post-Op  Procedure(s) (LRB): TOTAL KNEE ARTHROPLASTY (Right)  ADDITIONAL DIAGNOSIS:  Active Problems:   S/P total knee arthroplasty  Acute Blood Loss Anemia   Plan: Physical Therapy as ordered Weight Bearing as Tolerated (WBAT)  DVT Prophylaxis:  Lovenox  DISCHARGE PLAN: Home  DISCHARGE NEEDS: HHPT, CPM, Walker and 3-in-1 comode seat         Angel Boyd 10/30/2014, 2:42 PM

## 2014-10-30 NOTE — Progress Notes (Signed)
Physical Therapy Treatment Patient Details Name: Angel Boyd MRN: [id number] DOB: 1946-04-23 Today's Date: 10/30/2014    History of Present Illness 69 y.o. male admitted to [facility] on 10/29/14 for elective R TKA.  Pt with no significant PMHx.     PT Comments    Pt is POD #1 and was able to progress gait with RW out into the hallway.  He continues to have some knee instability in stance which he compensates for with strong bil upper extremities.  His true test will be his ability to WB enough to do stairs this PM.  He is hopeful to do well enough to go home.  PT will follow acutely.   Follow Up Recommendations  Home health PT;Supervision for mobility/OOB     Equipment Recommendations  None recommended by PT    Recommendations for Other Services   NA     Precautions / Restrictions Precautions Precautions: Knee Restrictions Weight Bearing Restrictions: Yes RLE Weight Bearing: Weight bearing as tolerated    Mobility  Bed Mobility Overal bed mobility: Modified Independent Bed Mobility: Supine to Sit     Supine to sit: Modified independent (Device/Increase time);HOB elevated (pt used bed rails and leg loop to manage his R leg)     General bed mobility comments: Pt taught how to use blanket as leg loop to get his own leg out of the bed.  HOB elevated ~20 degrees and pt using bed rail for leverage of upper body.   Transfers Overall transfer level: Needs assistance Equipment used: Rolling walker (2 wheeled) Transfers: Sit to/from Stand Sit to Stand: Min guard         General transfer comment: min guard to close supervision for transitions.  Pt needs verbal cues for safe hand placement and foot placement during transitions.   Ambulation/Gait Ambulation/Gait assistance: Min guard Ambulation Distance (Feet): 120 Feet Assistive device: Rolling walker (2 wheeled) Gait Pattern/deviations: Step-through pattern;Antalgic (flexed knee)     General Gait Details: Pt with flexed knee  gait pattern with some minimal buckling in stance.  Encouraged upright posture and to really engage quad or push back when he puts pressure on his right knee.  Heavy reliance on hands for support.  Reinforced verbally correct LE sequence for safety. Cues to breathe throughout.        Balance Overall balance assessment: Needs assistance Sitting-balance support: Feet supported;No upper extremity supported Sitting balance-Leahy Scale: Good     Standing balance support: Single extremity supported;Bilateral upper extremity supported;No upper extremity supported Standing balance-Leahy Scale: Fair                      Cognition Arousal/Alertness: Awake/alert Behavior During Therapy: WFL for tasks assessed/performed Overall Cognitive Status: Within Functional Limits for tasks assessed                      Exercises Total Joint Exercises Ankle Circles/Pumps: AROM;Both;20 reps;Supine Quad Sets: AROM;Right;10 reps;Supine Towel Squeeze: AROM;Both;10 reps;Supine Heel Slides: AAROM;Right;10 reps;Supine        Pertinent Vitals/Pain Pain Assessment: 0-10 Pain Score: 3  Pain Location: at rest in right knee Pain Descriptors / Indicators: Aching;Burning Pain Intervention(s): Limited activity within patient's tolerance;Monitored during session;Repositioned;Ice applied    PT Goals (current goals can now be found in the care plan section) Acute Rehab PT Goals Patient Stated Goal: to go home after his PM session today.  Progress towards PT goals: Progressing toward goals    Frequency  7X/week  PT Plan Current plan remains appropriate       End of Session   Activity Tolerance: Patient limited by fatigue;Patient limited by pain Patient left: in chair;with call bell/phone within reach;with family/visitor present     Time: 1004-1036 PT Time Calculation (min) (ACUTE ONLY): 32 min  Charges:  $Gait Training: 8-22 mins $Therapeutic Exercise: 8-22 mins                       Baljit Liebert B. [place], [place], DPT [phone]   10/30/2014, 10:43 AM

## 2014-10-30 NOTE — Progress Notes (Signed)
Physical Therapy Treatment Patient Details Name: Angel Boyd MRN: [id number] DOB: [birth year] Today's Date: 10/30/2014    History of Present Illness 69 y.o. male admitted to [facility] on 10/29/14 for elective R TKA.  Pt with no significant PMHx.     PT Comments    Pt is POD #1 and this is his second session. He is mobilizing well enough to go home today if MD deems appropriate.  He was able to walk quite a good distance with RW down the hallway and supervision and showed proficiency on the stairs with cane and railing. Wife present for education.  PT will continue to follow acutely as long as he is here in the hospital. D/c plan remains the same.   Follow Up Recommendations  Home health PT;Supervision for mobility/OOB     Equipment Recommendations  None recommended by PT    Recommendations for Other Services   NA     Precautions / Restrictions Precautions Precautions: Knee Restrictions RLE Weight Bearing: Weight bearing as tolerated    Mobility  Bed Mobility Overal bed mobility: Modified Independent Bed Mobility: Supine to Sit;Sit to Supine     Supine to sit: Modified independent (Device/Increase time) Sit to supine: Modified independent (Device/Increase time)   General bed mobility comments: Pt using leg loop (blanket for both into and out of bed transitions. HOB flat.    Transfers Overall transfer level: Needs assistance Equipment used: Rolling walker (2 wheeled) Transfers: Sit to/from Stand Sit to Stand: Min guard         General transfer comment: Min guard assist for safety due to transitions are slow, and pt goes up over flexed knees bil.   Ambulation/Gait Ambulation/Gait assistance: Supervision Ambulation Distance (Feet): 180 Feet Assistive device: Rolling walker (2 wheeled) Gait Pattern/deviations: Step-through pattern;Antalgic     General Gait Details: Verbal cues for smoother transition of RW, heel to toe gait pattern with quad activation in stance and  upright posture.     Stairs Stairs: Yes Stairs assistance: Min assist Stair Management: One rail Right;One rail Left;Step to pattern;Forwards;With cane Number of Stairs: 10 General stair comments: Pt using cane and rail to simulate home entry and steps to get up to bedroom. Verbal cues for correct LE sequencing.           Balance Overall balance assessment: Needs assistance Sitting-balance support: Feet supported;No upper extremity supported Sitting balance-Leahy Scale: Good     Standing balance support: Bilateral upper extremity supported;Single extremity supported;No upper extremity supported Standing balance-Leahy Scale: Fair                      Cognition Arousal/Alertness: Awake/alert Behavior During Therapy: WFL for tasks assessed/performed Overall Cognitive Status: Within Functional Limits for tasks assessed                      Exercises Total Joint Exercises Ankle Circles/Pumps: AROM;Both;20 reps;Supine Quad Sets: AROM;Right;10 reps;Supine Towel Squeeze: AROM;Both;10 reps;Supine Heel Slides: AAROM;Right;10 reps;Supine Knee Flexion: AROM;AAROM;Right;10 reps;Seated Goniometric ROM: 15-70 flexion        Pertinent Vitals/Pain Pain Assessment: 0-10 Pain Score: 6  Pain Location: right knee Pain Descriptors / Indicators: Aching;Burning Pain Intervention(s): Limited activity within patient's tolerance;Monitored during session;Repositioned;RN gave pain meds during session           PT Goals (current goals can now be found in the care plan section) Acute Rehab PT Goals Patient Stated Goal: to go home after his PM session today.  Progress  towards PT goals: Progressing toward goals    Frequency  7X/week    PT Plan Current plan remains appropriate       End of Session   Activity Tolerance: Patient limited by pain Patient left: in CPM;with call bell/phone within reach;with family/visitor present     Time: 1339-1415 PT Time Calculation  (min) (ACUTE ONLY): 36 min  Charges:  $Gait Training: 23-37 mins $Therapeutic Exercise: 8-22 mins                      Sarabeth Benton B. [place], [place], DPT [phone]   10/30/2014, 2:25 PM

## 2014-10-30 NOTE — Evaluation (Signed)
Occupational Therapy Evaluation Patient Details Name: Angel Boyd MRN: [id number] DOB: 06/28/1946 Today's Date: 10/30/2014    History of Present Illness 69 y.o. male admitted to [facility] on 10/29/14 for elective R TKA.  Pt with no significant PMHx.    Clinical Impression   Patient admitted with above. Patient independent>occasional mod I PTA. Patient currently functioning at a min assist>supervision level. D/C from acute OT services and no additional follow-up OT needs at this time. Patient reports wife and family will be available to assist 24/7. All appropriate education provided to patient. Please re-order OT if needed.      Follow Up Recommendations  No OT follow up;Supervision/Assistance - 24 hour    Equipment Recommendations  None recommended by OT    Recommendations for Other Services  None at this time     Precautions / Restrictions Precautions Precautions: Knee Precaution Comments: reviewed knee precautions and no pillow under knee Restrictions Weight Bearing Restrictions: Yes RLE Weight Bearing: Weight bearing as tolerated      Mobility - Per PT note Bed Mobility Overal bed mobility: Modified Independent Bed Mobility: Supine to Sit;Sit to Supine     Supine to sit: Modified independent (Device/Increase time) Sit to supine: Modified independent (Device/Increase time)   General bed mobility comments: Pt using leg loop (blanket for both into and out of bed transitions. HOB flat.    Transfers Overall transfer level: Needs assistance Equipment used: Rolling walker (2 wheeled) Transfers: Sit to/from Stand Sit to Stand: Min guard         General transfer comment: Min guard assist for safety due to transitions are slow, and pt goes up over flexed knees bil.     Balance - Per PT note Overall balance assessment: Needs assistance Sitting-balance support: Feet supported;No upper extremity supported Sitting balance-Leahy Scale: Good     Standing balance support:  Bilateral upper extremity supported;Single extremity supported;No upper extremity supported Standing balance-Leahy Scale: Fair    ADL Overall ADL's : Needs assistance/impaired General ADL Comments: Patient requires assistance with LB ADLs and up to min guard with mobility (per PT note). Patient refused any mobility this afternoon secondary to up and about with PT earlier and pt currently on CPM. Patient states that his wife and family will be available to assist him 24/7 prn. Encouraged patient to reach > BLEs for LB ADLs to increase independence. Discussed use of 3-in1 over toilet and use of tub transfer bench in tub/shower for safety. Patient and daughter verbalized understanding of education. Discussed all this with wife when she re-entered room.     Pertinent Vitals/Pain Pain Assessment: 0-10 Pain Score: 3  Pain Location: right knee Pain Descriptors / Indicators: Aching Pain Intervention(s): Monitored during session     Hand Dominance Right   Extremity/Trunk Assessment Upper Extremity Assessment Upper Extremity Assessment: Overall WFL for tasks assessed   Lower Extremity Assessment Lower Extremity Assessment: Defer to PT evaluation   Cervical / Trunk Assessment Cervical / Trunk Assessment: Normal   Communication Communication Communication: No difficulties   Cognition Arousal/Alertness: Awake/alert Behavior During Therapy: WFL for tasks assessed/performed Overall Cognitive Status: Within Functional Limits for tasks assessed              Home Living Family/patient expects to be discharged to:: Private residence Living Arrangements: Spouse/significant other Available Help at Discharge: Family;Available 24 hours/day Type of Home: House Home Access: Stairs to enter [employer] of Steps: 2 Entrance Stairs-Rails: Left Home Layout: Two level;Bed/bath upstairs Alternate Level Stairs-Number of Steps:  flight Alternate Level Stairs-Rails: Left;Right (start with  left, platform, then both) Bathroom Shower/Tub: Tub/shower unit;Curtain   Bathroom Toilet: Handicapped height     Home Equipment: Environmental consultant - 2 wheels;Cane - quad;Cane - single point;Bedside commode;Tub bench   Prior Functioning/Environment Level of Independence: Independent with assistive device(s)   Comments: on occation he used cane, mostly limped.      OT Diagnosis:  n/a, no acute OT needs identified    OT Problem List:   n/a, no acute OT needs identified    OT Treatment/Interventions:   n/a, no acute OT needs identified    OT Goals(Current goals can be found in the care plan section) Acute Rehab OT Goals Patient Stated Goal: go home today  OT Frequency:   n/a, no acute OT needs identified    Barriers to D/C:  None at this time          End of Session CPM Right Knee CPM Right Knee: On Right Knee Flexion (Degrees): 55  Activity Tolerance: Other (comment) (Pt refused getting up with OT 2/2 up with PT recently and CPM) Patient left: in bed;with call bell/phone within reach;with family/visitor present   Time: 1445-1456 OT Time Calculation (min): 11 min Charges:  OT General Charges $OT Visit: 1 Procedure OT Evaluation $Initial OT Evaluation Tier I: 1 Procedure  Julliana Whitmyer , MS, OTR/L, CLT Pager: [phone]  10/30/2014, 3:11 PM

## 2014-10-30 NOTE — Plan of Care (Signed)
Problem: Consults Goal: Diagnosis- Total Joint Replacement Primary Total Knee     

## 2014-10-30 NOTE — Discharge Instructions (Signed)
INSTRUCTIONS AFTER JOINT REPLACEMENT   o Remove items at home which could result in a fall. This includes throw rugs or furniture in walking pathways o ICE to the affected joint every three hours while awake for 30 minutes at a time, for at least the first 3-5 days, and then as needed for pain and swelling.  Continue to use ice for pain and swelling. You may notice swelling that will progress down to the foot and ankle.  This is normal after surgery.  Elevate your leg when you are not up walking on it.   o Continue to use the breathing machine you got in the hospital (incentive spirometer) which will help keep your temperature down.  It is common for your temperature to cycle up and down following surgery, especially at night when you are not up moving around and exerting yourself.  The breathing machine keeps your lungs expanded and your temperature down.   DIET:  As you were doing prior to hospitalization, we recommend a well-balanced diet.  DRESSING / WOUND CARE / SHOWERING  May shower normally wednesday  ACTIVITY  o Increase activity slowly as tolerated, but follow the weight bearing instructions below.   o No driving for 6 weeks or until further direction given by your physician.  You cannot drive while taking narcotics.  o No lifting or carrying greater than 10 lbs. until further directed by your surgeon. o Avoid periods of inactivity such as sitting longer than an hour when not asleep. This helps prevent blood clots.  o You may return to work once you are authorized by your doctor.     WEIGHT BEARING   Weight bearing as tolerated with assist device (walker, cane, etc) as directed, use it as long as suggested by your surgeon or therapist, typically at least 4-6 weeks.   EXERCISES  Results after joint replacement surgery are often greatly improved when you follow the exercise, range of motion and muscle strengthening exercises prescribed by your doctor. Safety measures are also  important to protect the joint from further injury. Any time any of these exercises cause you to have increased pain or swelling, decrease what you are doing until you are comfortable again and then slowly increase them. If you have problems or questions, call your caregiver or physical therapist for advice.   Rehabilitation is important following a joint replacement. After just a few days of immobilization, the muscles of the leg can become weakened and shrink (atrophy).  These exercises are designed to build up the tone and strength of the thigh and leg muscles and to improve motion. Often times heat used for twenty to thirty minutes before working out will loosen up your tissues and help with improving the range of motion but do not use heat for the first two weeks following surgery (sometimes heat can increase post-operative swelling).   These exercises can be done on a training (exercise) mat, on the floor, on a table or on a bed. Use whatever works the best and is most comfortable for you.    Use music or television while you are exercising so that the exercises are a pleasant break in your day. This will make your life better with the exercises acting as a break in your routine that you can look forward to.   Perform all exercises about fifteen times, three times per day or as directed.  You should exercise both the operative leg and the other leg as well.  Exercises include:  Quad Sets - Tighten up the muscle on the front of the thigh (Quad) and hold for 5-10 seconds.   °• Straight Leg Raises - With your knee straight (if you were given a brace, keep it on), lift the leg to 60 degrees, hold for 3 seconds, and slowly lower the leg.  Perform this exercise against resistance later as your leg gets stronger.  °• Leg Slides: Lying on your back, slowly slide your foot toward your buttocks, bending your knee up off the floor (only go as far as is comfortable). Then slowly slide your foot back down until  your leg is flat on the floor again.  °• Angel Wings: Lying on your back spread your legs to the side as far apart as you can without causing discomfort.  °• Hamstring Strength:  Lying on your back, push your heel against the floor with your leg straight by tightening up the muscles of your buttocks.  Repeat, but this time bend your knee to a comfortable angle, and push your heel against the floor.  You may put a pillow under the heel to make it more comfortable if necessary.  ° °A rehabilitation program following joint replacement surgery can speed recovery and prevent re-injury in the future due to weakened muscles. Contact your doctor or a physical therapist for more information on knee rehabilitation.  ° ° °CONSTIPATION ° °Constipation is defined medically as fewer than three stools per week and severe constipation as less than one stool per week.  Even if you have a regular bowel pattern at home, your normal regimen is likely to be disrupted due to multiple reasons following surgery.  Combination of anesthesia, postoperative narcotics, change in appetite and fluid intake all can affect your bowels.  ° °YOU MUST use at least one of the following options; they are listed in order of increasing strength to get the job done.  They are all available over the counter, and you may need to use some, POSSIBLY even all of these options:   ° °Drink plenty of fluids (prune juice may be helpful) and high fiber foods °Colace 100 mg by mouth twice a day  °Senokot for constipation as directed and as needed Dulcolax (bisacodyl), take with full glass of water  °Miralax (polyethylene glycol) once or twice a day as needed. ° °If you have tried all these things and are unable to have a bowel movement in the first 3-4 days after surgery call either your surgeon or your primary doctor.   ° °If you experience loose stools or diarrhea, hold the medications until you stool forms back up.  If your symptoms do not get better within 1 week  or if they get worse, check with your doctor.  If you experience "the worst abdominal pain ever" or develop nausea or vomiting, please contact the office immediately for further recommendations for treatment. ° ° °ITCHING:  If you experience itching with your medications, try taking only a single pain pill, or even half a pain pill at a time.  You can also use Benadryl over the counter for itching or also to help with sleep.  ° °TED HOSE STOCKINGS:  Use stockings on both legs until for at least 2 weeks or as directed by physician office. They may be removed at night for sleeping. ° °MEDICATIONS:  See your medication summary on the “After Visit Summary” that nursing will review with you.  You may have some home medications which will be placed on hold until   you complete the course of blood thinner medication.  It is important for you to complete the blood thinner medication as prescribed. ° °PRECAUTIONS:  If you experience chest pain or shortness of breath - call 911 immediately for transfer to the hospital emergency department.  ° °If you develop a fever greater that 101 F, purulent drainage from wound, increased redness or drainage from wound, foul odor from the wound/dressing, or calf pain - CONTACT YOUR SURGEON.   °                                                °FOLLOW-UP APPOINTMENTS:  If you do not already have a post-op appointment, please call the office for an appointment to be seen by your surgeon.  Guidelines for how soon to be seen are listed in your “After Visit Summary”, but are typically between 1-4 weeks after surgery. ° °OTHER INSTRUCTIONS:  ° °Knee Replacement:  Do not place pillow under knee, focus on keeping the knee straight while resting. CPM instructions: 0-90 degrees, 2 hours in the morning, 2 hours in the afternoon, and 2 hours in the evening. Place foam block, curve side up under heel at all times except when in CPM or when walking.  DO NOT modify, tear, cut, or change the foam block in any  way. ° °MAKE SURE YOU:  °• Understand these instructions.  °• Get help right away if you are not doing well or get worse.  ° ° °Thank you for letting us be a part of your medical care team.  It is a privilege we respect greatly.  We hope these instructions will help you stay on track for a fast and full recovery!  ° °

## 2014-10-30 NOTE — Progress Notes (Signed)
[name] discharged home per MD order. Discharge instructions reviewed and discussed with patient. All questions and concerns answered. Copy of instructions and scripts given to patient. IV removed.  Patient escorted to car by staff in a wheelchair. No distress noted upon discharge.   Nicki Reaper [place] 10/30/2014 7:00 PM

## 2014-11-01 NOTE — Discharge Summary (Signed)
SPORTS MEDICINE & JOINT REPLACEMENT   Lara Mulch, MD   [name], PA-C [place], [place], [place]  [address]                             [phone]  PATIENT ID: Angel Boyd        MRN:  [id number]          DOB/AGE: 1946/02/28 / 69 y.o.    DISCHARGE SUMMARY  ADMISSION DATE:    10/29/2014 DISCHARGE DATE:   412/16  ADMISSION DIAGNOSIS: primary osteoarthritis right knee    DISCHARGE DIAGNOSIS:  primary osteoarthritis right knee    ADDITIONAL DIAGNOSIS: Active Problems:   S/P total knee arthroplasty  Past Medical History  Diagnosis Date  . Arthritis   . Cancer     skin cancer removed  . Urticaria   . Basal cell carcinoma   . Hearing loss   . Snoring     PROCEDURE: Procedure(s): TOTAL KNEE ARTHROPLASTY on 10/29/2014  CONSULTS:     HISTORY:  See H&P in chart  HOSPITAL COURSE:  Angel Boyd is a 69 y.o. admitted on 10/29/2014 and found to have a diagnosis of primary osteoarthritis right knee.  After appropriate laboratory studies were obtained  they were taken to the operating room on 10/29/2014 and underwent Procedure(s): TOTAL KNEE ARTHROPLASTY.   They were given perioperative antibiotics:  Anti-infectives    Start     Dose/Rate Route Frequency Ordered Stop   10/29/14 2000  vancomycin (VANCOCIN) IVPB 1000 mg/200 mL premix     1,000 mg 200 mL/hr over 60 Minutes Intravenous Every 12 hours 10/29/14 1507 10/29/14 2100   10/29/14 0900  vancomycin (VANCOCIN) IVPB 1000 mg/200 mL premix     1,000 mg 200 mL/hr over 60 Minutes Intravenous To Short Stay 10/29/14 0855 10/29/14 1015   10/29/14 0600  ceFAZolin (ANCEF) IVPB 2 g/50 mL premix  Status:  Discontinued     2 g 100 mL/hr over 30 Minutes Intravenous On call to O.R. 10/28/14 1315 10/29/14 1423    .  Tolerated the procedure well.  Placed with a foley intraoperatively.  Given Ofirmev at induction and for 48 hours.    POD# 1: Vital signs were stable.  Patient denied Chest pain, shortness of breath, or  calf pain.  Patient was started on Lovenox 30 mg subcutaneously twice daily at 8am.  Consults to PT, OT, and care management were made.  The patient was weight bearing as tolerated.  CPM was placed on the operative leg 0-90 degrees for 6-8 hours a day.  Incentive spirometry was taught.  Dressing was changed.  Hemovac was discontinued.      POD #2, Continued  PT for ambulation and exercise program.  IV saline locked.  O2 discontinued.    The remainder of the hospital course was dedicated to ambulation and strengthening.   The patient was discharged on 1 day post op in  Good condition.  Blood products given:none  DIAGNOSTIC STUDIES: Recent vital signs: No data found.      Recent laboratory studies:  Recent Labs  10/29/14 1720 10/30/14 0600  WBC 14.1* 8.7  HGB 14.7 13.0  HCT 44.6 39.4  PLT 188 181    Recent Labs  10/29/14 1720 10/30/14 0600  NA  --  134*  K  --  4.2  CL  --  100  CO2  --  29  BUN  --  9  CREATININE 0.99 0.94  GLUCOSE  --  122*  CALCIUM  --  8.4   Lab Results  Component Value Date   INR 1.01 10/23/2014     Recent Radiographic Studies :  No results found.  DISCHARGE INSTRUCTIONS: Discharge Instructions    CPM    Complete by:  As directed   Continuous passive motion machine (CPM):      Use the CPM from 0 to 90 for 6-8 hours per day.      You may increase by 10 per day.  You may break it up into 2 or 3 sessions per day.      Use CPM for 2 weeks or until you are told to stop.     Call MD / Call 911    Complete by:  As directed   If you experience chest pain or shortness of breath, CALL 911 and be transported to the hospital emergency room.  If you develope a fever above 101 F, pus (white drainage) or increased drainage or redness at the wound, or calf pain, call your surgeon's office.     Change dressing    Complete by:  As directed   Change dressing on wednesday, then change the dressing daily with sterile 4 x 4 inch gauze dressing and apply TED  hose.     Constipation Prevention    Complete by:  As directed   Drink plenty of fluids.  Prune juice may be helpful.  You may use a stool softener, such as Colace (over the counter) 100 mg twice a day.  Use MiraLax (over the counter) for constipation as needed.     Diet - low sodium heart healthy    Complete by:  As directed      Do not put a pillow under the knee. Place it under the heel.    Complete by:  As directed      Driving restrictions    Complete by:  As directed   No driving for 6 weeks     Increase activity slowly as tolerated    Complete by:  As directed      Lifting restrictions    Complete by:  As directed   No lifting for 6 weeks     TED hose    Complete by:  As directed   Use stockings (TED hose) for 2 weeks on both leg(s).  You may remove them at night for sleeping.           DISCHARGE MEDICATIONS:     Medication List    TAKE these medications        enoxaparin 40 MG/0.4ML injection  Commonly known as:  LOVENOX  Inject 0.4 mLs (40 mg total) into the skin daily.     Fish Oil 1200 MG Caps  Take 1 capsule by mouth daily.     ibuprofen 800 MG tablet  Commonly known as:  ADVIL,MOTRIN  Take 1 tablet (800 mg total) by mouth 3 (three) times daily.     loratadine 10 MG tablet  Commonly known as:  CLARITIN  Take 10 mg by mouth 2 (two) times daily.     multivitamin with minerals tablet  Take 1 tablet by mouth daily.     OVER THE COUNTER MEDICATION  Take 1 capsule by mouth daily. Curamin     oxyCODONE 5 MG immediate release tablet  Commonly known as:  Oxy IR/ROXICODONE  Take 1-2 tablets (5-10 mg total) by mouth every 3 (  three) hours as needed for breakthrough pain.     OxyCODONE 20 mg T12a 12 hr tablet  Commonly known as:  OXYCONTIN  Take 1 tablet (20 mg total) by mouth every 12 (twelve) hours.     tadalafil 5 MG tablet  Commonly known as:  CIALIS  Take 5 mg by mouth every other day.     tiZANidine 2 MG tablet  Commonly known as:  ZANAFLEX  Take  1 tablet (2 mg total) by mouth every 6 (six) hours as needed.        FOLLOW UP VISIT:       Follow-up Information    Follow up with [facility].   Why:  Someone from [facility] will contact you concerning start date and time for therapy.   Contact information:   [address] [place] [address] [phone]       Follow up with Rudean Haskell, MD. Call on 11/13/2014.   Specialty:  Orthopedic Surgery   Contact information:   [place] [address] [phone]       DISPOSITION: HOME   CONDITION:  Good   Angel Boyd 11/01/2014, 2:45 PM

## 2015-11-18 IMAGING — CR DG CHEST 2V
2 series · 2 of 2 positions shown · non-contrast
Comparison: None.

CLINICAL DATA: Preoperative evaluation for knee replacement

EXAM:
CHEST  2 VIEW

[w chest pa]
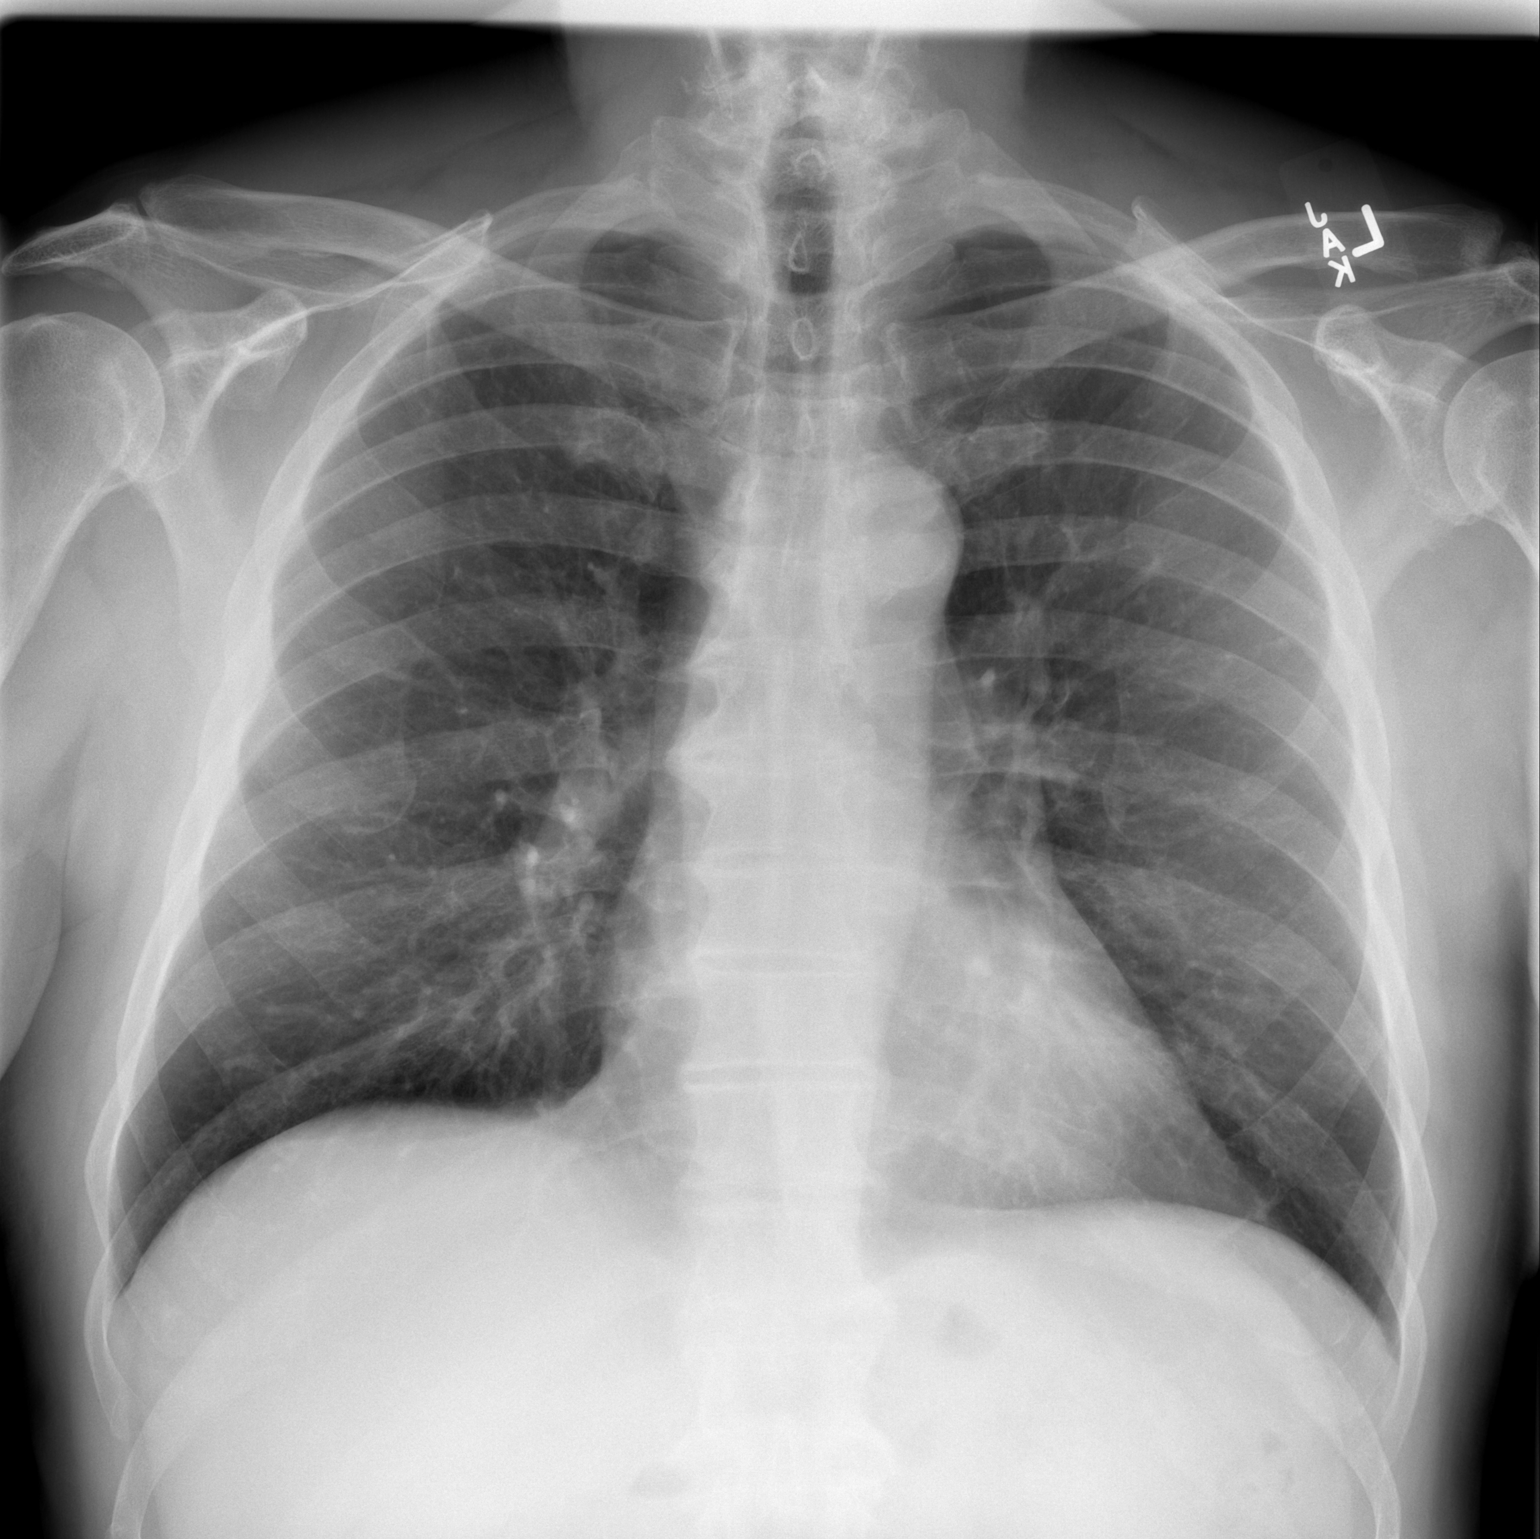

[w chest lat]
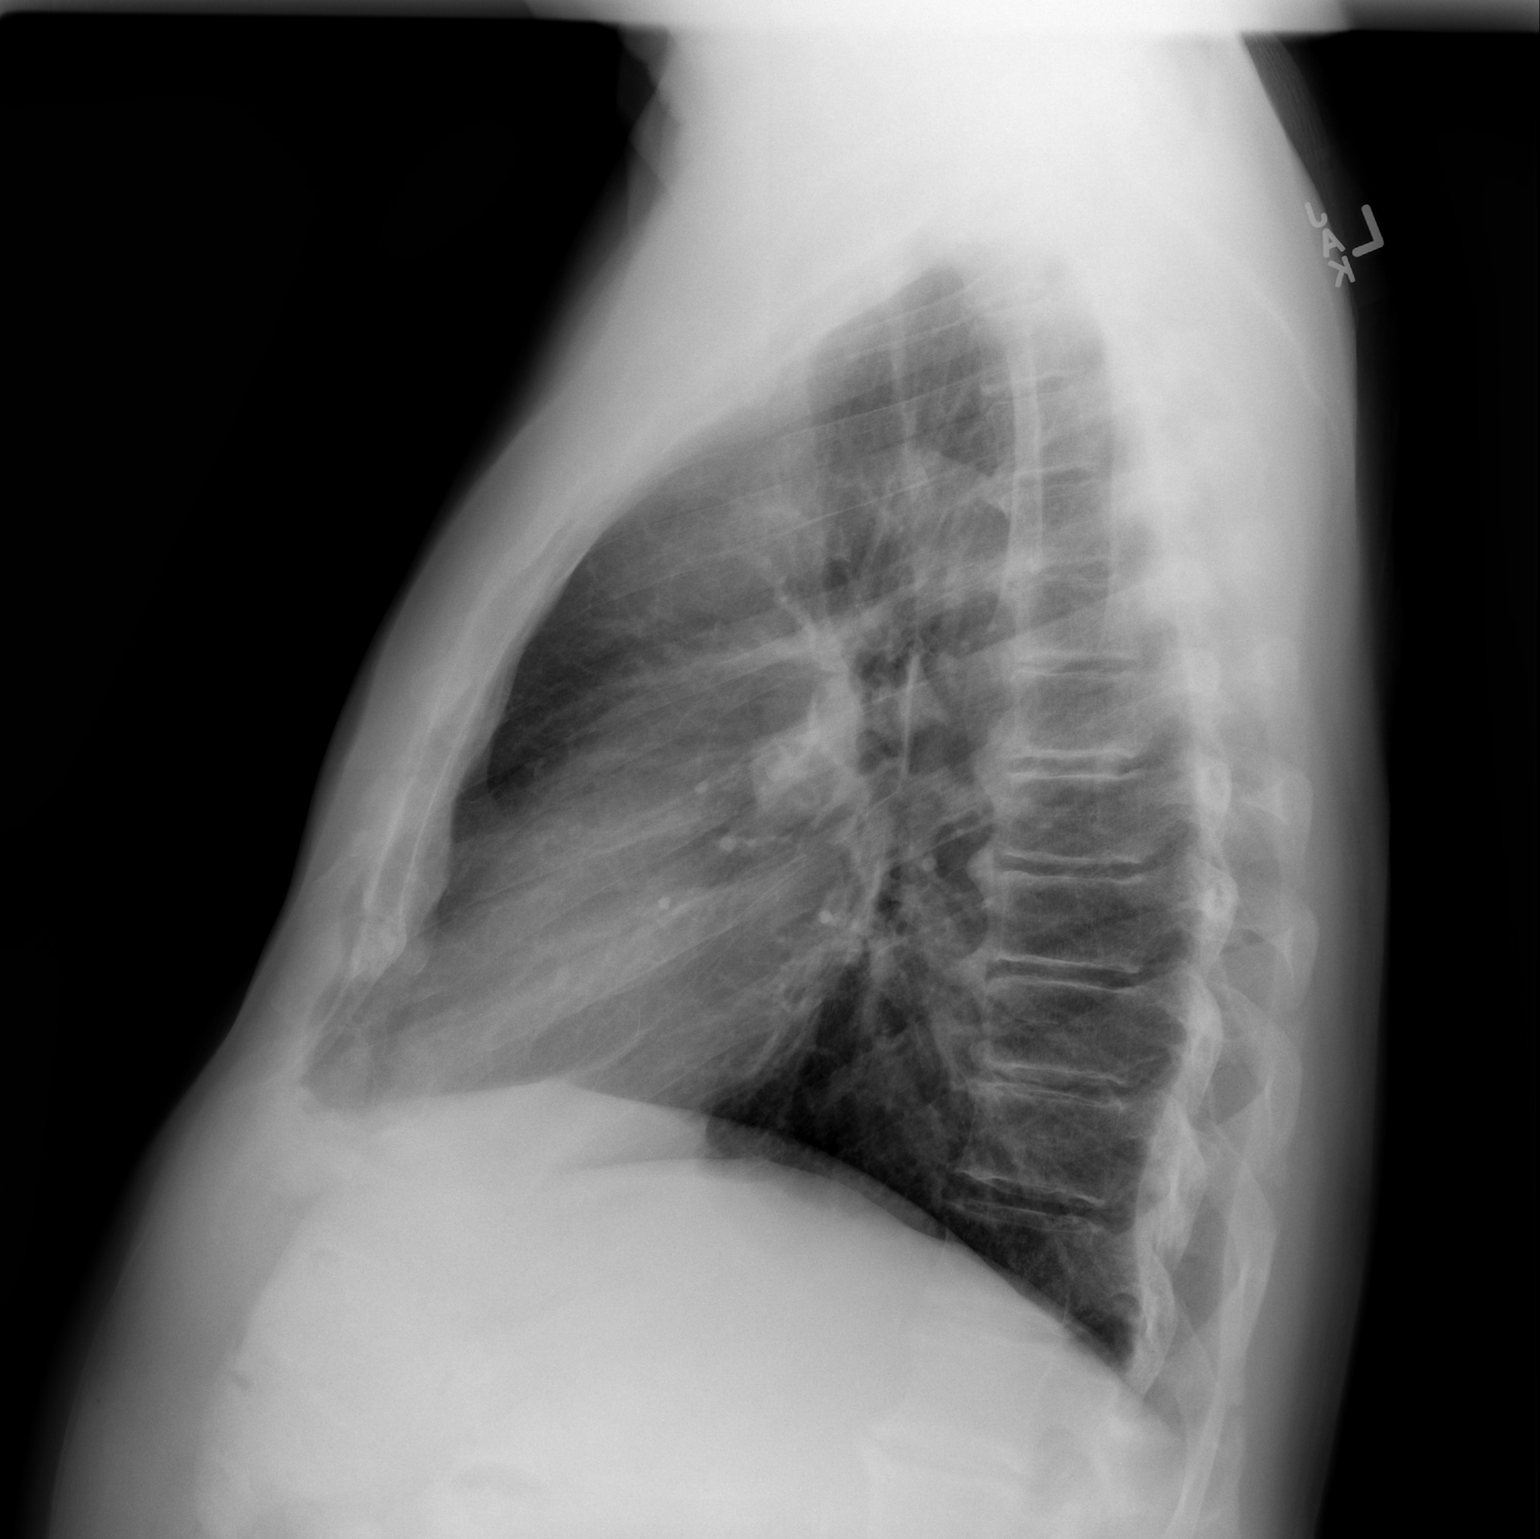

[2 of 2 positions shown; findings below may reference images not displayed]

FINDINGS: The heart size and mediastinal contours are within normal limits.
Both lungs are clear. The visualized skeletal structures show
degenerative changes of the thoracic spine.
IMPRESSION: No active cardiopulmonary disease.

## 2019-12-11 ENCOUNTER — Ambulatory Visit: Payer: Medicare Other | Attending: Internal Medicine

## 2019-12-11 DIAGNOSIS — Z20822 Contact with and (suspected) exposure to covid-19: Secondary | ICD-10-CM

## 2019-12-12 LAB — SARS-COV-2, NAA 2 DAY TAT

## 2019-12-12 LAB — NOVEL CORONAVIRUS, NAA: SARS-CoV-2, NAA: DETECTED — AB

## 2019-12-13 ENCOUNTER — Ambulatory Visit (HOSPITAL_COMMUNITY)
Admission: RE | Admit: 2019-12-13 | Discharge: 2019-12-13 | Disposition: A | Discharge status: Home / Self Care | Payer: Medicare Other | Source: Ambulatory Visit | Attending: Pulmonary Disease | Admitting: Pulmonary Disease

## 2019-12-13 ENCOUNTER — Telehealth: Payer: Self-pay | Admitting: Physician Assistant

## 2019-12-13 ENCOUNTER — Other Ambulatory Visit: Payer: Self-pay | Admitting: Physician Assistant

## 2019-12-13 DIAGNOSIS — U071 COVID-19: Secondary | ICD-10-CM | POA: Diagnosis present

## 2019-12-13 DIAGNOSIS — Z23 Encounter for immunization: Secondary | ICD-10-CM | POA: Insufficient documentation

## 2019-12-13 MED ORDER — FAMOTIDINE IN NACL 20-0.9 MG/50ML-% IV SOLN: 20.0000 mg | Freq: Once | INTRAVENOUS | Status: DC | PRN

## 2019-12-13 MED ORDER — EPINEPHRINE 0.3 MG/0.3ML IJ SOAJ: 0.3000 mg | Freq: Once | INTRAMUSCULAR | Status: DC | PRN

## 2019-12-13 MED ORDER — ALBUTEROL SULFATE HFA 108 (90 BASE) MCG/ACT IN AERS: 2.0000 | INHALATION_SPRAY | Freq: Once | RESPIRATORY_TRACT | Status: DC | PRN

## 2019-12-13 MED ORDER — SODIUM CHLORIDE 0.9 % IV SOLN
Freq: Once | INTRAVENOUS | Status: AC
  Filled 2019-12-13: qty 700

## 2019-12-13 MED ORDER — DIPHENHYDRAMINE HCL 50 MG/ML IJ SOLN: 50.0000 mg | Freq: Once | INTRAMUSCULAR | Status: DC | PRN

## 2019-12-13 MED ORDER — METHYLPREDNISOLONE SODIUM SUCC 125 MG IJ SOLR: 125.0000 mg | Freq: Once | INTRAMUSCULAR | Status: DC | PRN

## 2019-12-13 MED ORDER — SODIUM CHLORIDE 0.9 % IV SOLN: INTRAVENOUS | Status: DC | PRN

## 2019-12-13 NOTE — Discharge Instructions (Signed)

## 2019-12-13 NOTE — Telephone Encounter (Addendum)
Spoke with wife sharon about monoclonal Ab infusion. He is on day 10 of symptoms today ( currently sleeping).  They will call us back.  [name] PA-C  [facility]

## 2019-12-13 NOTE — Progress Notes (Signed)
  I connected by phone with Angel Boyd on 12/13/2019 at 9:10 AM to discuss the potential use of an new treatment for mild to moderate COVID-19 viral infection in non-hospitalized patients.  This patient is a 74 y.o. male that meets the FDA criteria for Emergency Use Authorization of bamlanivimab/etesevimab or casirivimab/imdevimab.  Has a (+) direct SARS-CoV-2 viral test result  Has mild or moderate COVID-19   Is ? 76 years of age and weighs ? 40 kg  Is NOT hospitalized due to COVID-19  Is NOT requiring oxygen therapy or requiring an increase in baseline oxygen flow rate due to COVID-19  Is within 10 days of symptom onset  Has at least one of the high risk factor(s) for progression to severe COVID-19 and/or hospitalization as defined in EUA.  Specific high risk criteria : >/= 76 yo   I have spoken and communicated the following to the patient or parent/caregiver:  1. FDA has authorized the emergency use of bamlanivimab/etesevimab and casirivimab\imdevimab for the treatment of mild to moderate COVID-19 in adults and pediatric patients with positive results of direct SARS-CoV-2 viral testing who are 61 years of age and older weighing at least 40 kg, and who are at high risk for progressing to severe COVID-19 and/or hospitalization.  2. The significant known and potential risks and benefits of bamlanivimab/etesevimab and casirivimab\imdevimab, and the extent to which such potential risks and benefits are unknown.  3. Information on available alternative treatments and the risks and benefits of those alternatives, including clinical trials.  4. Patients treated with bamlanivimab/etesevimab and casirivimab\imdevimab should continue to self-isolate and use infection control measures (e.g., wear mask, isolate, social distance, avoid sharing personal items, clean and disinfect "high touch" surfaces, and frequent handwashing) according to CDC guidelines.   5. The patient or parent/caregiver  has the option to accept or refuse bamlanivimab/etesevimab or casirivimab\imdevimab .   After reviewing this information with the patient, The patient agreed to proceed with receiving the bamlanimivab infusion and will be provided a copy of the Fact sheet prior to receiving the infusion..  Sx onset 5/16. Set up for infusion today at 12:30pm.  [name] 12/13/2019 9:10 AM

## 2019-12-13 NOTE — Progress Notes (Signed)
  Diagnosis: COVID-19  Physician:Dr Joya Gaskins  Procedure: [facility] Med: bamlanivimab\etesevimab infusion - Provided patient with bamlanimivab\etesevimab fact sheet for patients, parents and caregivers prior to infusion.  Complications: No immediate complications noted.  Discharge: Discharged home   [place], [place] 12/13/2019

## 2020-03-26 ENCOUNTER — Encounter (INDEPENDENT_AMBULATORY_CARE_PROVIDER_SITE_OTHER): Payer: Self-pay | Admitting: Ophthalmology

## 2020-03-26 ENCOUNTER — Ambulatory Visit (INDEPENDENT_AMBULATORY_CARE_PROVIDER_SITE_OTHER): Payer: Medicare Other | Admitting: Ophthalmology

## 2020-03-26 ENCOUNTER — Other Ambulatory Visit: Payer: Self-pay

## 2020-03-26 DIAGNOSIS — H2513 Age-related nuclear cataract, bilateral: Secondary | ICD-10-CM | POA: Diagnosis not present

## 2020-03-26 DIAGNOSIS — H353131 Nonexudative age-related macular degeneration, bilateral, early dry stage: Secondary | ICD-10-CM

## 2020-03-26 DIAGNOSIS — H35722 Serous detachment of retinal pigment epithelium, left eye: Secondary | ICD-10-CM

## 2020-03-26 NOTE — Assessment & Plan Note (Signed)

## 2020-03-26 NOTE — Progress Notes (Signed)
03/26/2020     CHIEF COMPLAINT Patient presents for Retina Evaluation   HISTORY OF PRESENT ILLNESS: Angel Boyd is a 74 y.o. male who presents to the clinic today for:   HPI    Retina Evaluation    In both eyes.  This started 1 day ago.  Duration of 1 day.  Treatments tried include no treatments.          Comments    NP - CSR OU - Ref'd by Dr. Sharen Counter  Pt denies any noticeable symptoms in [place] OU, and sts he was just seeing Dr. Sharen Counter for his routine check-up. Pt denies ocular pain, flashes, floaters, or blurry [facility] OU.       Last edited by Rockie Neighbours, [place] on 03/26/2020  8:40 AM. (History)      Referring physician: Lujean Amel, MD [place] [address],  [place] [address]  HISTORICAL INFORMATION:   Selected notes from the [place]: No current outpatient medications on file. (Ophthalmic Drugs)   No current facility-administered medications for this visit. (Ophthalmic Drugs)   Current Outpatient Medications (Other)  Medication Sig  . enoxaparin (LOVENOX) 40 MG/0.4ML injection Inject 0.4 mLs (40 mg total) into the skin daily.  Marland Kitchen ibuprofen (ADVIL,MOTRIN) 800 MG tablet Take 1 tablet (800 mg total) by mouth 3 (three) times daily.  Marland Kitchen loratadine (CLARITIN) 10 MG tablet Take 10 mg by mouth 2 (two) times daily.  . Multiple Vitamins-Minerals (MULTIVITAMIN WITH MINERALS) tablet Take 1 tablet by mouth daily.  . Omega-3 Fatty Acids (FISH OIL) 1200 MG CAPS Take 1 capsule by mouth daily.  Marland Kitchen OVER THE COUNTER MEDICATION Take 1 capsule by mouth daily. Curamin  . oxyCODONE (OXY IR/ROXICODONE) 5 MG immediate release tablet Take 1-2 tablets (5-10 mg total) by mouth every 3 (three) hours as needed for breakthrough pain.  . OxyCODONE (OXYCONTIN) 20 mg T12A 12 hr tablet Take 1 tablet (20 mg total) by mouth every 12 (twelve) hours.  . tadalafil (CIALIS) 5 MG tablet Take 5 mg by mouth every other day.  Marland Kitchen tiZANidine (ZANAFLEX) 2 MG tablet  Take 1 tablet (2 mg total) by mouth every 6 (six) hours as needed.   No current facility-administered medications for this visit. (Other)      REVIEW OF SYSTEMS:    ALLERGIES Allergies  Allergen Reactions  . Ampicillin Hives  . Azithromycin Hives, Itching and Swelling    PAST MEDICAL HISTORY Past Medical History:  Diagnosis Date  . Arthritis   . Basal cell carcinoma   . Cancer ([place])    skin cancer removed  . Hearing loss   . Snoring   . Urticaria    Past Surgical History:  Procedure Laterality Date  . COLONOSCOPY W/ POLYPECTOMY    . SKIN CANCER EXCISION     neck and head  . TOTAL KNEE ARTHROPLASTY Right 10/29/2014   Procedure: TOTAL KNEE ARTHROPLASTY;  Surgeon: Vickey Huger, MD;  Location: [facility];  Service: Orthopedics;  Laterality: Right;    FAMILY HISTORY History reviewed. No pertinent family history.  SOCIAL HISTORY Social History   Tobacco Use  . Smoking status: Never Smoker  . Smokeless tobacco: Never Used  Substance Use Topics  . Alcohol use: Yes    Comment: occasional  . Drug use: No         OPHTHALMIC EXAM:  Base Eye Exam    Visual Acuity (ETDRS)  Right Left   Dist cc 20/20 20/20 -1   Correction: Glasses       Tonometry (Tonopen, 8:41 AM)      Right Left   Pressure 17 18       Pupils      Pupils Dark Light Shape React APD   Right PERRL 4 3 Round Brisk None   Left PERRL 4 3 Round Brisk None       Visual Fields (Counting fingers)      Left Right    Full Full       Extraocular Movement      Right Left    Full Full       Neuro/Psych    Oriented x3: Yes   Mood/Affect: Normal       Dilation    Both eyes: 1.0% Mydriacyl, 2.5% Phenylephrine @ 8:44 AM        Slit Lamp and Fundus Exam    External Exam      Right Left   External Normal Normal       Slit Lamp Exam      Right Left   Lids/Lashes Normal Normal   Conjunctiva/Sclera White and quiet White and quiet   Cornea Clear Clear   Anterior Chamber Deep and quiet  Deep and quiet   Iris Round and reactive Round and reactive   Lens Nuclear sclerosis 2+ Nuclear sclerosis 2+   Anterior Vitreous Normal Normal       Fundus Exam      Right Left   Posterior Vitreous Normal Normal   Disc Normal Normal   C/D Ratio 0.3 0.3   Macula Normal Early age related macular degeneration, Retinal pigment epithelial detachment , small, inferotemporal to faz, no fluid, no hemorrhage   Vessels Normal Normal   Periphery Normal Normal          IMAGING AND PROCEDURES  Imaging and Procedures for 03/26/20  OCT, Retina - OU - Both Eyes       Right Eye Scan locations included subfoveal. Central Foveal Thickness: 283. Progression has no prior data. Findings include vitreomacular adhesion , normal observations.   Left Eye Quality was good. Scan locations included subfoveal. Central Foveal Thickness: 286. Progression has no prior data. Findings include vitreomacular adhesion , pigment epithelial detachment, no IRF, no SRF.   Notes Pigment epithelial detachment inferotemporal to the foveal avascular zone OS, with no intraretinal fluid, no subretinal fluid, no signs of invasion into the retina.  Scarce thickening of Bruch's membrane and/or drusen deposition.  No active maculopathy                ASSESSMENT/PLAN:  Nuclear sclerotic cataract of both eyes The nature of cataract was discussed with the patient as well as the elective nature of surgery. The patient was reassured that surgery at a later date does not put the patient at risk for a worse outcome. It was emphasized that the need for surgery is dictated by the patient's quality of life as influenced by the cataract. Patient was instructed to maintain close follow up with their general eye care doctor.      ICD-10-CM   1. 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 Early stage nonexudative age-related macular degeneration of both eyes  H35.3131 OCT, Retina -  OU - Both Eyes  3. Nuclear sclerotic cataract of both eyes  H25.13     1.  OS, with retinal pigment  epithelial detachment inferotemporal to the fovea, no active maculopathy.  No specific therapy is warranted at this time.  His otherwise mild ARMD does not meet the requirements for use of AREDS 2 supplements yet I would go ahead and recommend its use on the hope that this might slow the aging process of the retina diffusely  2.  Had some a lengthy discussion regarding his cataract in each eye and the implants to dark yellow-green sunglasses and its effect on low lighting conditions, driving at night, going dark rainy days follow-up with Dr. Luz Brazen as scheduled  3.  Ophthalmic Meds Ordered this visit:  No orders of the defined types were placed in this encounter.      Return in about 3 months (around 06/25/2020) for DILATE OU, OCT.  Patient Instructions  Monitor vision at home. Take AREDS 2    Explained the diagnoses, plan, and follow up with the patient and they expressed understanding.  Patient expressed understanding of the importance of proper follow up care.   Clent Demark Eisen Robenson M.D. Diseases & Surgery of the Retina and Vitreous Retina & Diabetic [place] 03/26/20     Abbreviations: M myopia (nearsighted); A astigmatism; H hyperopia (farsighted); P presbyopia; Mrx spectacle prescription;  CTL contact lenses; OD right eye; OS left eye; OU both eyes  XT exotropia; ET esotropia; PEK punctate epithelial keratitis; PEE punctate epithelial erosions; DES dry eye syndrome; MGD meibomian gland dysfunction; ATs artificial tears; PFAT's preservative free artificial tears; [place] nuclear sclerotic cataract; PSC posterior subcapsular cataract; ERM epi-retinal membrane; PVD posterior vitreous detachment; RD retinal detachment; DM diabetes mellitus; DR diabetic retinopathy; NPDR non-proliferative diabetic retinopathy; PDR proliferative diabetic retinopathy; CSME clinically significant macular  edema; DME diabetic macular edema; dbh dot blot hemorrhages; CWS cotton wool spot; POAG primary open angle glaucoma; C/D cup-to-disc ratio; HVF humphrey visual field; GVF goldmann visual field; OCT optical coherence tomography; IOP intraocular pressure; BRVO Branch retinal vein occlusion; CRVO central retinal vein occlusion; CRAO central retinal artery occlusion; BRAO branch retinal artery occlusion; RT retinal tear; SB scleral buckle; PPV pars plana vitrectomy; VH Vitreous hemorrhage; PRP panretinal laser photocoagulation; IVK intravitreal kenalog; VMT vitreomacular traction; MH Macular hole;  NVD neovascularization of the disc; NVE neovascularization elsewhere; AREDS age related eye disease study; ARMD age related macular degeneration; POAG primary open angle glaucoma; EBMD epithelial/anterior basement membrane dystrophy; ACIOL anterior chamber intraocular lens; IOL intraocular lens; PCIOL posterior chamber intraocular lens; Phaco/IOL phacoemulsification with intraocular lens placement; [place] photorefractive keratectomy; LASIK laser assisted in situ keratomileusis; HTN hypertension; DM diabetes mellitus; COPD chronic obstructive pulmonary disease

## 2020-03-26 NOTE — Patient Instructions (Signed)
Monitor vision at home. Take AREDS 2

## 2020-06-25 ENCOUNTER — Encounter (INDEPENDENT_AMBULATORY_CARE_PROVIDER_SITE_OTHER): Payer: Self-pay | Admitting: Ophthalmology

## 2020-06-25 ENCOUNTER — Other Ambulatory Visit: Payer: Self-pay

## 2020-06-25 ENCOUNTER — Ambulatory Visit (INDEPENDENT_AMBULATORY_CARE_PROVIDER_SITE_OTHER): Payer: Medicare Other | Admitting: Ophthalmology

## 2020-06-25 DIAGNOSIS — H35722 Serous detachment of retinal pigment epithelium, left eye: Secondary | ICD-10-CM

## 2020-06-25 DIAGNOSIS — H353131 Nonexudative age-related macular degeneration, bilateral, early dry stage: Secondary | ICD-10-CM

## 2020-06-25 DIAGNOSIS — H2513 Age-related nuclear cataract, bilateral: Secondary | ICD-10-CM

## 2020-06-25 NOTE — Patient Instructions (Signed)
Patient instructed to contact Dr. Luz Brazen

## 2020-06-25 NOTE — Assessment & Plan Note (Signed)

## 2020-06-25 NOTE — Assessment & Plan Note (Signed)
Stable OS, no change over time, no signs of CNVM

## 2020-06-25 NOTE — Progress Notes (Signed)
06/25/2020     CHIEF COMPLAINT Patient presents for Retina Follow Up   HISTORY OF PRESENT ILLNESS: Angel Boyd is a 74 y.o. male who presents to the clinic today for:   HPI    Retina Follow Up    Diagnosis: RPE Detachment.  In left eye.  Severity is moderate.  Duration of 3 months.  Since onset it is stable.  I, the attending physician,  performed the HPI with the patient and updated documentation appropriately.          Comments    3 Month f\u OU. OCT  Pt states when OU become tired he has double vision. Denies floaters and FOL.       Last edited by Tilda Franco on 06/25/2020  8:09 AM. (History)      Referring physician: Lujean Amel, MD [address] [place],  [place] [address]  HISTORICAL INFORMATION:   Selected notes from the [facility]: No current outpatient medications on file. (Ophthalmic Drugs)   No current facility-administered medications for this visit. (Ophthalmic Drugs)   Current Outpatient Medications (Other)  Medication Sig  . enoxaparin (LOVENOX) 40 MG/0.4ML injection Inject 0.4 mLs (40 mg total) into the skin daily.  Marland Kitchen ibuprofen (ADVIL,MOTRIN) 800 MG tablet Take 1 tablet (800 mg total) by mouth 3 (three) times daily.  Marland Kitchen loratadine (CLARITIN) 10 MG tablet Take 10 mg by mouth 2 (two) times daily.  . Multiple Vitamins-Minerals (MULTIVITAMIN WITH MINERALS) tablet Take 1 tablet by mouth daily.  . Omega-3 Fatty Acids (FISH OIL) 1200 MG CAPS Take 1 capsule by mouth daily.  Marland Kitchen OVER THE COUNTER MEDICATION Take 1 capsule by mouth daily. Curamin  . oxyCODONE (OXY IR/ROXICODONE) 5 MG immediate release tablet Take 1-2 tablets (5-10 mg total) by mouth every 3 (three) hours as needed for breakthrough pain.  . OxyCODONE (OXYCONTIN) 20 mg T12A 12 hr tablet Take 1 tablet (20 mg total) by mouth every 12 (twelve) hours.  . tadalafil (CIALIS) 5 MG tablet Take 5 mg by mouth every other day.  Marland Kitchen tiZANidine  (ZANAFLEX) 2 MG tablet Take 1 tablet (2 mg total) by mouth every 6 (six) hours as needed.   No current facility-administered medications for this visit. (Other)      REVIEW OF SYSTEMS:    ALLERGIES Allergies  Allergen Reactions  . Ampicillin Hives  . Azithromycin Hives, Itching and Swelling    PAST MEDICAL HISTORY Past Medical History:  Diagnosis Date  . Arthritis   . Basal cell carcinoma   . Cancer ([facility])    skin cancer removed  . Hearing loss   . Snoring   . Urticaria    Past Surgical History:  Procedure Laterality Date  . COLONOSCOPY W/ POLYPECTOMY    . SKIN CANCER EXCISION     neck and head  . TOTAL KNEE ARTHROPLASTY Right 10/29/2014   Procedure: TOTAL KNEE ARTHROPLASTY;  Surgeon: Vickey Huger, MD;  Location: [facility];  Service: Orthopedics;  Laterality: Right;    FAMILY HISTORY History reviewed. No pertinent family history.  SOCIAL HISTORY Social History   Tobacco Use  . Smoking status: Never Smoker  . Smokeless tobacco: Never Used  Substance Use Topics  . Alcohol use: Yes    Comment: occasional  . Drug use: No         OPHTHALMIC EXAM:  Base Eye Exam    Visual Acuity (Snellen - Linear)  Right Left   Dist cc 20/20 -1 20/25 -1   Correction: Glasses       Tonometry (Tonopen, 8:13 AM)      Right Left   Pressure 18 17       Pupils      Pupils Dark Light Shape React APD   Right PERRL 4 3 Round Brisk None   Left PERRL 4 3 Round Brisk None       Visual Fields (Counting fingers)      Left Right    Full Full       Neuro/Psych    Oriented x3: Yes   Mood/Affect: Normal       Dilation    Both eyes: 1.0% Mydriacyl, 2.5% Phenylephrine @ 8:13 AM        Slit Lamp and Fundus Exam    External Exam      Right Left   External Normal Normal       Slit Lamp Exam      Right Left   Lids/Lashes Normal Normal   Conjunctiva/Sclera White and quiet White and quiet   Cornea Clear Clear   Anterior Chamber Deep and quiet Deep and quiet   Iris  Round and reactive Round and reactive   Lens Nuclear sclerosis 2+, central NS, outer cortex clear Nuclear sclerosis 2+, central NS, outer cortex clear   Anterior Vitreous Normal Normal       Fundus Exam      Right Left   Posterior Vitreous Normal Normal   Disc Normal Normal   C/D Ratio 0.3 0.3   Macula Normal Early age related macular degeneration, Retinal pigment epithelial detachment , small, inferotemporal to faz, no fluid, no hemorrhage   Vessels Normal Normal   Periphery Normal Normal          IMAGING AND PROCEDURES  Imaging and Procedures for 06/25/20  OCT, Retina - OU - Both Eyes       Right Eye Quality was good. Scan locations included subfoveal. Central Foveal Thickness: 287. Progression has been stable.   Left Eye Quality was good. Scan locations included subfoveal. Central Foveal Thickness: 294. Findings include vitreomacular adhesion , pigment epithelial detachment.   Notes No signs of active CNVM OS, will continue to monitor retinal pigment epithelial detachment inferotemporal to the fovea. Incidental vitreal macular adhesion in each eye, nonpathologic                ASSESSMENT/PLAN:  Nuclear sclerotic cataract of both eyes Cataract(s) account for the patient's complaint. I discussed the risks and benefits of cataract surgery. Options were explained to the patient. The patient understands that new glasses may not improve their vision and desires to have cataract surgery. I have recommended follow up with their general eye care doctor for evaluation and consideration of cataract extraction with new intraocular lens insertion.  I do recommend follow-up with Dr. Luz Brazen for consideration and discussion of his nighttime and low light visibility issues due to the central nuclear sclerosis present in each eye which is darker as well as  more dense than the surrounding cortex of the of the lens.  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 Checking an Amsler Grid daily with advice to return immediately should a distortion develop, was given to the patient. The patient 's smoking status now and in the past was determined and advice based on the AREDS study was provided regarding the consumption of antioxidant supplements. AREDS 2 vitamin formulation was recommended. Consumption of dark leafy vegetables and fresh fruits of various colors was recommended. Treatment modalities for wet macular degeneration particularly the use of intravitreal injections of anti-blood vessel growth factors was discussed with the patient. Avastin, Lucentis, and Eylea are the available options. On occasion, therapy includes the use of photodynamic therapy and thermal laser. Stressed to the patient do not rub eyes.  Patient was advised to check Amsler Grid daily and return immediately if changes are noted. Instructions on using the grid were given to the patient. All patient questions were answered.      ICD-10-CM   1. 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 Nuclear sclerotic cataract of both eyes  H25.13   3. Early stage nonexudative age-related macular degeneration of both eyes  H35.3131     1.  Intermediate age-related macular degeneration OS, OD, no interval change, no signs of CNVM will continue to monitor  2.  Patient does have significant visual complaints our particularly nighttime, and also needing down to use brighter lights to read and difficulty with driving during dark rainy days as well.  This completely  comports with the findings of central nuclear sclerosis seen on the his lens examination OU today.  I explained the patient that cataract surgery would have an excellent chance of clearing the visual acuity, brightening his vision extent that would probably be surprising to him  3.  I have asked patient to follow-up again with Dr. Luz Brazen for consideration of cataract evaluation and possible surgical consultation  Ophthalmic Meds Ordered this visit:  No orders of the defined types were placed in this encounter.      Return in about 6 months (around 12/24/2020) for DILATE OU, OCT.  Patient Instructions  Patient instructed to contact Dr. Luz Brazen    Explained the diagnoses, plan, and follow up with the patient and they expressed understanding.  Patient expressed understanding of the importance of proper follow up care.   Clent Demark Marjorie Lussier M.D. Diseases & Surgery of the Retina and Vitreous Retina & Diabetic [place] 06/25/20     Abbreviations: M myopia (nearsighted); A astigmatism; H hyperopia (farsighted); P presbyopia; Mrx spectacle prescription;  CTL contact lenses; OD right eye; OS left eye; OU both eyes  XT exotropia; ET esotropia; PEK punctate epithelial keratitis; PEE punctate epithelial erosions; DES dry eye syndrome; MGD meibomian gland dysfunction; ATs artificial tears; PFAT's preservative free artificial tears; [place] nuclear sclerotic cataract; PSC posterior subcapsular cataract; ERM epi-retinal membrane; PVD posterior vitreous detachment; RD retinal detachment; DM diabetes mellitus; DR diabetic retinopathy; NPDR non-proliferative diabetic retinopathy; PDR proliferative diabetic retinopathy; CSME clinically significant macular edema; DME diabetic macular edema; dbh dot blot hemorrhages; CWS cotton wool spot; POAG primary open angle glaucoma; C/D cup-to-disc ratio; HVF humphrey visual field; GVF goldmann visual field; OCT optical coherence tomography; IOP intraocular pressure; BRVO  Branch retinal vein occlusion; CRVO central retinal vein occlusion; CRAO central retinal artery occlusion; BRAO branch retinal artery occlusion; RT retinal tear; SB scleral buckle; PPV pars plana vitrectomy; VH Vitreous hemorrhage; PRP panretinal laser photocoagulation; IVK intravitreal kenalog; VMT  vitreomacular traction; MH Macular hole;  NVD neovascularization of the disc; NVE neovascularization elsewhere; AREDS age related eye disease study; ARMD age related macular degeneration; POAG primary open angle glaucoma; EBMD epithelial/anterior basement membrane dystrophy; ACIOL anterior chamber intraocular lens; IOL intraocular lens; PCIOL posterior chamber intraocular lens; Phaco/IOL phacoemulsification with intraocular lens placement; [place] photorefractive keratectomy; LASIK laser assisted in situ keratomileusis; HTN hypertension; DM diabetes mellitus; COPD chronic obstructive pulmonary disease

## 2020-06-25 NOTE — Assessment & Plan Note (Signed)
Cataract(s) account for the patient's complaint. I discussed the risks and benefits of cataract surgery. Options were explained to the patient. The patient understands that new glasses may not improve their vision and desires to have cataract surgery. I have recommended follow up with their general eye care doctor for evaluation and consideration of cataract extraction with new intraocular lens insertion.  I do recommend follow-up with Dr. Luz Brazen for consideration and discussion of his nighttime and low light visibility issues due to the central nuclear sclerosis present in each eye which is darker as well as  more dense than the surrounding cortex of the of the lens.  This type of central nuclear sclerosis tends to be visually highly significant particularly nighttime or low light, dark rainy day, type situations

## 2020-08-14 DIAGNOSIS — Z79899 Other long term (current) drug therapy: Secondary | ICD-10-CM | POA: Diagnosis not present

## 2020-08-14 DIAGNOSIS — Z0001 Encounter for general adult medical examination with abnormal findings: Secondary | ICD-10-CM | POA: Diagnosis not present

## 2020-08-14 DIAGNOSIS — E7800 Pure hypercholesterolemia, unspecified: Secondary | ICD-10-CM | POA: Diagnosis not present

## 2020-09-06 DIAGNOSIS — H5032 Intermittent alternating esotropia: Secondary | ICD-10-CM | POA: Diagnosis not present

## 2020-12-24 ENCOUNTER — Ambulatory Visit (INDEPENDENT_AMBULATORY_CARE_PROVIDER_SITE_OTHER): Payer: Medicare Other | Admitting: Ophthalmology

## 2020-12-24 ENCOUNTER — Encounter (INDEPENDENT_AMBULATORY_CARE_PROVIDER_SITE_OTHER): Payer: Self-pay | Admitting: Ophthalmology

## 2020-12-24 ENCOUNTER — Other Ambulatory Visit: Payer: Self-pay

## 2020-12-24 DIAGNOSIS — H43823 Vitreomacular adhesion, bilateral: Secondary | ICD-10-CM | POA: Diagnosis not present

## 2020-12-24 DIAGNOSIS — H2513 Age-related nuclear cataract, bilateral: Secondary | ICD-10-CM | POA: Diagnosis not present

## 2020-12-24 DIAGNOSIS — H35722 Serous detachment of retinal pigment epithelium, left eye: Secondary | ICD-10-CM

## 2020-12-24 DIAGNOSIS — H353131 Nonexudative age-related macular degeneration, bilateral, early dry stage: Secondary | ICD-10-CM | POA: Diagnosis not present

## 2020-12-24 NOTE — Assessment & Plan Note (Signed)

## 2020-12-24 NOTE — Progress Notes (Signed)
12/24/2020     CHIEF COMPLAINT Patient presents for Retina Follow Up (6 Mo F/U OU//Pt c/o diplopia off and on OU. Pt sts he has new gls rx with prism OU. No other new symptoms OU.)   HISTORY OF PRESENT ILLNESS: Angel Boyd is a 75 y.o. male who presents to the clinic today for:   HPI    Retina Follow Up    Diagnosis: Other   Laterality: both eyes   Onset: 6 months ago   Severity: mild   Duration: 6 months   Course: stable   Comments: 6 Mo F/U OU  Pt c/o diplopia off and on OU. Pt sts he has new gls rx with prism OU. No other new symptoms OU.       Last edited by Rockie Neighbours, [place] on 12/24/2020  8:13 AM. (History)      Referring physician: Lujean Amel, MD [facility] [address] [place],  [place] [address]  HISTORICAL INFORMATION:   Selected notes from the [facility]: No current outpatient medications on file. (Ophthalmic Drugs)   No current facility-administered medications for this visit. (Ophthalmic Drugs)   Current Outpatient Medications (Other)  Medication Sig  . enoxaparin (LOVENOX) 40 MG/0.4ML injection Inject 0.4 mLs (40 mg total) into the skin daily.  Marland Kitchen ibuprofen (ADVIL,MOTRIN) 800 MG tablet Take 1 tablet (800 mg total) by mouth 3 (three) times daily.  Marland Kitchen loratadine (CLARITIN) 10 MG tablet Take 10 mg by mouth 2 (two) times daily.  . Multiple Vitamins-Minerals (MULTIVITAMIN WITH MINERALS) tablet Take 1 tablet by mouth daily.  . Omega-3 Fatty Acids (FISH OIL) 1200 MG CAPS Take 1 capsule by mouth daily.  Marland Kitchen OVER THE COUNTER MEDICATION Take 1 capsule by mouth daily. Curamin  . oxyCODONE (OXY IR/ROXICODONE) 5 MG immediate release tablet Take 1-2 tablets (5-10 mg total) by mouth every 3 (three) hours as needed for breakthrough pain.  . OxyCODONE (OXYCONTIN) 20 mg T12A 12 hr tablet Take 1 tablet (20 mg total) by mouth every 12 (twelve) hours.  . tadalafil (CIALIS) 5 MG tablet Take 5 mg by mouth every other day.  Marland Kitchen  tiZANidine (ZANAFLEX) 2 MG tablet Take 1 tablet (2 mg total) by mouth every 6 (six) hours as needed.   No current facility-administered medications for this visit. (Other)      REVIEW OF SYSTEMS:    ALLERGIES Allergies  Allergen Reactions  . Ampicillin Hives  . Azithromycin Hives, Itching and Swelling    PAST MEDICAL HISTORY Past Medical History:  Diagnosis Date  . Arthritis   . Basal cell carcinoma   . Cancer ([facility])    skin cancer removed  . Hearing loss   . Snoring   . Urticaria    Past Surgical History:  Procedure Laterality Date  . COLONOSCOPY W/ POLYPECTOMY    . SKIN CANCER EXCISION     neck and head  . TOTAL KNEE ARTHROPLASTY Right 10/29/2014   Procedure: TOTAL KNEE ARTHROPLASTY;  Surgeon: Vickey Huger, MD;  Location: [facility];  Service: Orthopedics;  Laterality: Right;    FAMILY HISTORY History reviewed. No pertinent family history.  SOCIAL HISTORY Social History   Tobacco Use  . Smoking status: Never Smoker  . Smokeless tobacco: Never Used  Substance Use Topics  . Alcohol use: Yes    Comment: occasional  . Drug use: No         OPHTHALMIC EXAM: Base Eye Exam  Visual Acuity (ETDRS)      Right Left   Dist cc 20/20 20/20 -2   Correction: Glasses       Tonometry (Tonopen, 8:15 AM)      Right Left   Pressure 12 14       Pupils      Pupils Dark Light Shape React APD   Right PERRL 4 3 Round Brisk None   Left PERRL 4 3 Round Brisk None       Visual Fields (Counting fingers)      Left Right    Full Full       Extraocular Movement      Right Left    Full Full       Neuro/Psych    Oriented x3: Yes   Mood/Affect: Normal       Dilation    Both eyes: 1.0% Mydriacyl, 2.5% Phenylephrine @ 8:16 AM        Slit Lamp and Fundus Exam    External Exam      Right Left   External Normal Normal       Slit Lamp Exam      Right Left   Lids/Lashes Normal Normal   Conjunctiva/Sclera White and quiet White and quiet   Cornea Clear Clear    Anterior Chamber Deep and quiet Deep and quiet   Iris Round and reactive Round and reactive   Lens Nuclear sclerosis 2+, central NS, outer cortex clear Nuclear sclerosis 2+, central NS, outer cortex clear   Anterior Vitreous Normal Normal       Fundus Exam      Right Left   Posterior Vitreous Normal Normal   Disc Normal Normal   C/D Ratio 0.3 0.3   Macula Normal Early age related macular degeneration, Retinal pigment epithelial detachment , small, inferotemporal to faz, no fluid, no hemorrhage   Vessels Normal Normal   Periphery Normal Normal          IMAGING AND PROCEDURES  Imaging and Procedures for 12/24/20  OCT, Retina - OU - Both Eyes       Right Eye Quality was good. Scan locations included subfoveal. Central Foveal Thickness: 289. Progression has been stable.   Left Eye Quality was good. Scan locations included subfoveal. Central Foveal Thickness: 294. Findings include vitreomacular adhesion , pigment epithelial detachment.   Notes No signs of active CNVM OS, will continue to monitor retinal pigment epithelial detachment inferotemporal to the fovea. Incidental vitreal macular adhesion in each eye, nonpathologic                  ASSESSMENT/PLAN:  Nuclear sclerotic cataract of both eyes The nature of cataract was discussed with the patient as well as the elective nature of surgery. The patient was reassured that surgery at a later date does not put the patient at risk for a worse outcome. It was emphasized that the need for surgery is dictated by the patient's quality of life as influenced by the cataract. Patient was instructed to maintain close follow up with their general eye care doctor.  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 Checking an Amsler Grid daily with advice to return immediately should a distortion  develop, was given to the patient. The patient 's smoking status now and in the past was determined and advice based on the AREDS study was provided regarding  the consumption of antioxidant supplements. AREDS 2 vitamin formulation was recommended. Consumption of dark leafy vegetables and fresh fruits of various colors was recommended. Treatment modalities for wet macular degeneration particularly the use of intravitreal injections of anti-blood vessel growth factors was discussed with the patient. Avastin, Lucentis, and Eylea are the available options. On occasion, therapy includes the use of photodynamic therapy and thermal laser. Stressed to the patient do not rub eyes.  Patient was advised to check Amsler Grid daily and return immediately if changes are noted. Instructions on using the grid were given to the patient. All patient questions were answered.  Early stage nonexudative age-related macular degeneration of both eyes The nature of age--related macular degeneration was discussed with the patient as well as the distinction between dry and wet types. Checking an Amsler Grid daily with advice to return immediately should a distortion develop, was given to the patient. The patient 's smoking status now and in the past was determined and advice based on the AREDS study was provided regarding the consumption of antioxidant supplements. AREDS 2 vitamin formulation was recommended. Consumption of dark leafy vegetables and fresh fruits of various colors was recommended. Treatment modalities for wet macular degeneration particularly the use of intravitreal injections of anti-blood vessel growth factors was discussed with the patient. Avastin, Lucentis, and Eylea are the available options. On occasion, therapy includes the use of photodynamic therapy and thermal laser. Stressed to the patient do not rub eyes.  Patient was advised to check Amsler Grid daily and return immediately if changes are noted. Instructions  on using the grid were given to the patient. All patient questions were answered.  Vitreomacular adhesion of both eyes Physiologic condition, no progression no impact physiologically on the macula      ICD-10-CM   1. 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 Nuclear sclerotic cataract of both eyes  H25.13   3. Early stage nonexudative age-related macular degeneration of both eyes  H35.3131   4. Vitreomacular adhesion of both eyes  H43.823     1.  Dry age-related macular degeneration persist.  Small pigment epithelial detachment inferotemporally in the left eye as continue to solidify.  There may have been some role of vitreomacular adhesion triggering this yet there are certainly no signs of wet AMD progressing and in fact the PED appears to be resolving clinically otherwise, by OCT findings.  Will observe  2.  Follow-up with Dr. Teola Bradley as scheduled  3.  Ophthalmic Meds Ordered this visit:  No orders of the defined types were placed in this encounter.      Return in about 9 months (around 09/23/2021) for DILATE OU, OCT.  There are no Patient Instructions on file for this visit.   Explained the diagnoses, plan, and follow up with the patient and they expressed understanding.  Patient expressed understanding of the importance of proper follow up care.   Clent Demark Nevaen Tredway M.D. Diseases & Surgery of the Retina and Vitreous Retina & Diabetic [place] 12/24/20     Abbreviations: M myopia (nearsighted); A astigmatism; H hyperopia (farsighted); P presbyopia; Mrx spectacle prescription;  CTL contact lenses; OD right eye; OS left eye; OU both eyes  XT exotropia; ET esotropia; PEK punctate epithelial keratitis; PEE punctate epithelial erosions; DES dry eye syndrome; MGD meibomian gland dysfunction; ATs artificial tears; PFAT's preservative free artificial tears; [place] nuclear sclerotic cataract; PSC posterior subcapsular cataract; ERM  epi-retinal membrane; PVD  posterior vitreous detachment; RD retinal detachment; DM diabetes mellitus; DR diabetic retinopathy; NPDR non-proliferative diabetic retinopathy; PDR proliferative diabetic retinopathy; CSME clinically significant macular edema; DME diabetic macular edema; dbh dot blot hemorrhages; CWS cotton wool spot; POAG primary open angle glaucoma; C/D cup-to-disc ratio; HVF humphrey visual field; GVF goldmann visual field; OCT optical coherence tomography; IOP intraocular pressure; BRVO Branch retinal vein occlusion; CRVO central retinal vein occlusion; CRAO central retinal artery occlusion; BRAO branch retinal artery occlusion; RT retinal tear; SB scleral buckle; PPV pars plana vitrectomy; VH Vitreous hemorrhage; PRP panretinal laser photocoagulation; IVK intravitreal kenalog; VMT vitreomacular traction; MH Macular hole;  NVD neovascularization of the disc; NVE neovascularization elsewhere; AREDS age related eye disease study; ARMD age related macular degeneration; POAG primary open angle glaucoma; EBMD epithelial/anterior basement membrane dystrophy; ACIOL anterior chamber intraocular lens; IOL intraocular lens; PCIOL posterior chamber intraocular lens; Phaco/IOL phacoemulsification with intraocular lens placement; [place] photorefractive keratectomy; LASIK laser assisted in situ keratomileusis; HTN hypertension; DM diabetes mellitus; COPD chronic obstructive pulmonary disease

## 2020-12-24 NOTE — Assessment & Plan Note (Signed)

## 2020-12-24 NOTE — Assessment & Plan Note (Signed)
Physiologic condition, no progression no impact physiologically on the macula

## 2021-02-03 DIAGNOSIS — M50322 Other cervical disc degeneration at C5-C6 level: Secondary | ICD-10-CM | POA: Diagnosis not present

## 2021-02-03 DIAGNOSIS — M9901 Segmental and somatic dysfunction of cervical region: Secondary | ICD-10-CM | POA: Diagnosis not present

## 2021-02-03 DIAGNOSIS — M5134 Other intervertebral disc degeneration, thoracic region: Secondary | ICD-10-CM | POA: Diagnosis not present

## 2021-02-03 DIAGNOSIS — M9902 Segmental and somatic dysfunction of thoracic region: Secondary | ICD-10-CM | POA: Diagnosis not present

## 2021-02-06 DIAGNOSIS — M50322 Other cervical disc degeneration at C5-C6 level: Secondary | ICD-10-CM | POA: Diagnosis not present

## 2021-02-06 DIAGNOSIS — M9901 Segmental and somatic dysfunction of cervical region: Secondary | ICD-10-CM | POA: Diagnosis not present

## 2021-02-06 DIAGNOSIS — M5134 Other intervertebral disc degeneration, thoracic region: Secondary | ICD-10-CM | POA: Diagnosis not present

## 2021-02-06 DIAGNOSIS — M9902 Segmental and somatic dysfunction of thoracic region: Secondary | ICD-10-CM | POA: Diagnosis not present

## 2021-02-10 DIAGNOSIS — M9902 Segmental and somatic dysfunction of thoracic region: Secondary | ICD-10-CM | POA: Diagnosis not present

## 2021-02-10 DIAGNOSIS — M9901 Segmental and somatic dysfunction of cervical region: Secondary | ICD-10-CM | POA: Diagnosis not present

## 2021-02-10 DIAGNOSIS — M50322 Other cervical disc degeneration at C5-C6 level: Secondary | ICD-10-CM | POA: Diagnosis not present

## 2021-02-10 DIAGNOSIS — M5134 Other intervertebral disc degeneration, thoracic region: Secondary | ICD-10-CM | POA: Diagnosis not present

## 2021-02-17 DIAGNOSIS — M5134 Other intervertebral disc degeneration, thoracic region: Secondary | ICD-10-CM | POA: Diagnosis not present

## 2021-02-17 DIAGNOSIS — M9902 Segmental and somatic dysfunction of thoracic region: Secondary | ICD-10-CM | POA: Diagnosis not present

## 2021-02-17 DIAGNOSIS — M50322 Other cervical disc degeneration at C5-C6 level: Secondary | ICD-10-CM | POA: Diagnosis not present

## 2021-02-17 DIAGNOSIS — M9901 Segmental and somatic dysfunction of cervical region: Secondary | ICD-10-CM | POA: Diagnosis not present

## 2021-02-25 DIAGNOSIS — M9901 Segmental and somatic dysfunction of cervical region: Secondary | ICD-10-CM | POA: Diagnosis not present

## 2021-02-25 DIAGNOSIS — M5134 Other intervertebral disc degeneration, thoracic region: Secondary | ICD-10-CM | POA: Diagnosis not present

## 2021-02-25 DIAGNOSIS — M9902 Segmental and somatic dysfunction of thoracic region: Secondary | ICD-10-CM | POA: Diagnosis not present

## 2021-02-25 DIAGNOSIS — M50322 Other cervical disc degeneration at C5-C6 level: Secondary | ICD-10-CM | POA: Diagnosis not present

## 2021-03-10 DIAGNOSIS — M50322 Other cervical disc degeneration at C5-C6 level: Secondary | ICD-10-CM | POA: Diagnosis not present

## 2021-03-10 DIAGNOSIS — M9902 Segmental and somatic dysfunction of thoracic region: Secondary | ICD-10-CM | POA: Diagnosis not present

## 2021-03-10 DIAGNOSIS — M5134 Other intervertebral disc degeneration, thoracic region: Secondary | ICD-10-CM | POA: Diagnosis not present

## 2021-03-10 DIAGNOSIS — M9901 Segmental and somatic dysfunction of cervical region: Secondary | ICD-10-CM | POA: Diagnosis not present

## 2021-03-25 DIAGNOSIS — M9902 Segmental and somatic dysfunction of thoracic region: Secondary | ICD-10-CM | POA: Diagnosis not present

## 2021-03-25 DIAGNOSIS — M9901 Segmental and somatic dysfunction of cervical region: Secondary | ICD-10-CM | POA: Diagnosis not present

## 2021-03-25 DIAGNOSIS — M50322 Other cervical disc degeneration at C5-C6 level: Secondary | ICD-10-CM | POA: Diagnosis not present

## 2021-03-25 DIAGNOSIS — M5134 Other intervertebral disc degeneration, thoracic region: Secondary | ICD-10-CM | POA: Diagnosis not present

## 2021-04-22 DIAGNOSIS — M50322 Other cervical disc degeneration at C5-C6 level: Secondary | ICD-10-CM | POA: Diagnosis not present

## 2021-04-22 DIAGNOSIS — M9901 Segmental and somatic dysfunction of cervical region: Secondary | ICD-10-CM | POA: Diagnosis not present

## 2021-04-22 DIAGNOSIS — M5134 Other intervertebral disc degeneration, thoracic region: Secondary | ICD-10-CM | POA: Diagnosis not present

## 2021-04-22 DIAGNOSIS — M9902 Segmental and somatic dysfunction of thoracic region: Secondary | ICD-10-CM | POA: Diagnosis not present

## 2021-04-24 DIAGNOSIS — L821 Other seborrheic keratosis: Secondary | ICD-10-CM | POA: Diagnosis not present

## 2021-04-24 DIAGNOSIS — L570 Actinic keratosis: Secondary | ICD-10-CM | POA: Diagnosis not present

## 2021-04-24 DIAGNOSIS — D1801 Hemangioma of skin and subcutaneous tissue: Secondary | ICD-10-CM | POA: Diagnosis not present

## 2021-04-24 DIAGNOSIS — L738 Other specified follicular disorders: Secondary | ICD-10-CM | POA: Diagnosis not present

## 2021-04-24 DIAGNOSIS — L814 Other melanin hyperpigmentation: Secondary | ICD-10-CM | POA: Diagnosis not present

## 2021-04-24 DIAGNOSIS — C44529 Squamous cell carcinoma of skin of other part of trunk: Secondary | ICD-10-CM | POA: Diagnosis not present

## 2021-04-24 DIAGNOSIS — Z85828 Personal history of other malignant neoplasm of skin: Secondary | ICD-10-CM | POA: Diagnosis not present

## 2021-05-21 DIAGNOSIS — M9902 Segmental and somatic dysfunction of thoracic region: Secondary | ICD-10-CM | POA: Diagnosis not present

## 2021-05-21 DIAGNOSIS — M5134 Other intervertebral disc degeneration, thoracic region: Secondary | ICD-10-CM | POA: Diagnosis not present

## 2021-05-21 DIAGNOSIS — M9901 Segmental and somatic dysfunction of cervical region: Secondary | ICD-10-CM | POA: Diagnosis not present

## 2021-05-21 DIAGNOSIS — M50322 Other cervical disc degeneration at C5-C6 level: Secondary | ICD-10-CM | POA: Diagnosis not present

## 2021-06-05 ENCOUNTER — Other Ambulatory Visit: Payer: Self-pay | Admitting: Family Medicine

## 2021-06-05 DIAGNOSIS — I739 Peripheral vascular disease, unspecified: Secondary | ICD-10-CM

## 2021-06-10 ENCOUNTER — Ambulatory Visit
Admission: RE | Admit: 2021-06-10 | Discharge: 2021-06-10 | Disposition: A | Discharge status: Home / Self Care | Payer: Medicare Other | Source: Ambulatory Visit | Attending: Family Medicine | Admitting: Family Medicine

## 2021-06-10 DIAGNOSIS — I739 Peripheral vascular disease, unspecified: Secondary | ICD-10-CM

## 2021-06-23 DIAGNOSIS — M5134 Other intervertebral disc degeneration, thoracic region: Secondary | ICD-10-CM | POA: Diagnosis not present

## 2021-06-23 DIAGNOSIS — M9902 Segmental and somatic dysfunction of thoracic region: Secondary | ICD-10-CM | POA: Diagnosis not present

## 2021-06-23 DIAGNOSIS — M9901 Segmental and somatic dysfunction of cervical region: Secondary | ICD-10-CM | POA: Diagnosis not present

## 2021-06-23 DIAGNOSIS — M50322 Other cervical disc degeneration at C5-C6 level: Secondary | ICD-10-CM | POA: Diagnosis not present

## 2021-07-27 DIAGNOSIS — H6692 Otitis media, unspecified, left ear: Secondary | ICD-10-CM | POA: Diagnosis not present

## 2021-08-04 DIAGNOSIS — M9901 Segmental and somatic dysfunction of cervical region: Secondary | ICD-10-CM | POA: Diagnosis not present

## 2021-08-04 DIAGNOSIS — M50321 Other cervical disc degeneration at C4-C5 level: Secondary | ICD-10-CM | POA: Diagnosis not present

## 2021-08-06 DIAGNOSIS — H905 Unspecified sensorineural hearing loss: Secondary | ICD-10-CM | POA: Diagnosis not present

## 2021-08-21 DIAGNOSIS — E7800 Pure hypercholesterolemia, unspecified: Secondary | ICD-10-CM | POA: Diagnosis not present

## 2021-08-21 DIAGNOSIS — Z0001 Encounter for general adult medical examination with abnormal findings: Secondary | ICD-10-CM | POA: Diagnosis not present

## 2021-08-21 DIAGNOSIS — Z79899 Other long term (current) drug therapy: Secondary | ICD-10-CM | POA: Diagnosis not present

## 2021-09-15 DIAGNOSIS — M9901 Segmental and somatic dysfunction of cervical region: Secondary | ICD-10-CM | POA: Diagnosis not present

## 2021-09-15 DIAGNOSIS — M50321 Other cervical disc degeneration at C4-C5 level: Secondary | ICD-10-CM | POA: Diagnosis not present

## 2021-09-23 ENCOUNTER — Encounter (INDEPENDENT_AMBULATORY_CARE_PROVIDER_SITE_OTHER): Payer: Self-pay | Admitting: Ophthalmology

## 2021-09-23 ENCOUNTER — Ambulatory Visit (INDEPENDENT_AMBULATORY_CARE_PROVIDER_SITE_OTHER): Payer: Medicare Other | Admitting: Ophthalmology

## 2021-09-23 ENCOUNTER — Other Ambulatory Visit: Payer: Self-pay

## 2021-09-23 DIAGNOSIS — H2513 Age-related nuclear cataract, bilateral: Secondary | ICD-10-CM | POA: Diagnosis not present

## 2021-09-23 DIAGNOSIS — H353131 Nonexudative age-related macular degeneration, bilateral, early dry stage: Secondary | ICD-10-CM | POA: Diagnosis not present

## 2021-09-23 DIAGNOSIS — H35722 Serous detachment of retinal pigment epithelium, left eye: Secondary | ICD-10-CM

## 2021-09-23 DIAGNOSIS — H43823 Vitreomacular adhesion, bilateral: Secondary | ICD-10-CM | POA: Diagnosis not present

## 2021-09-23 NOTE — Assessment & Plan Note (Signed)
Minor OU D and OS ?

## 2021-09-23 NOTE — Assessment & Plan Note (Signed)
FAZ, no wet AMD as a complication of small PED temporal ?

## 2021-09-23 NOTE — Assessment & Plan Note (Signed)
Physiologic OU 

## 2021-09-23 NOTE — Assessment & Plan Note (Signed)
Sunglasses analogy discussed ? ?Symptoms of night vision difficulty or driving on dark rainy days might trouble him in the future.  This could be from the dark coloration changes from his cataract in each eye. ? ?Evaluate as needed ?

## 2021-09-23 NOTE — Progress Notes (Signed)
09/23/2021     CHIEF COMPLAINT Patient presents for  Chief Complaint  Patient presents with   Retina Follow Up      HISTORY OF PRESENT ILLNESS: Angel Boyd is a 76 y.o. male who presents to the clinic today for:   HPI   9 mos fu ou oct Pt states there are no changes to vision and medical history.  Pt is not experiencing any floaters or flashes of light. Pt had annual eye exam last week. Pt states" I Have prisms in my lens and have been diagnosed with adult strabismus" diagnosed by Dr. Marquette Saa (prior to the retirement of Dr. Annamaria Boots), and doing very well now with prism adjustment in his glasses Last edited by Hurman Horn, MD on 09/23/2021  9:15 AM.      Referring physician: Amedeo Kinsman, OD [phone] [address],  [place] [address]  HISTORICAL INFORMATION:   Selected notes from the MEDICAL RECORD NUMBER       CURRENT MEDICATIONS: No current outpatient medications on file. (Ophthalmic Drugs)   No current facility-administered medications for this visit. (Ophthalmic Drugs)   Current Outpatient Medications (Other)  Medication Sig   enoxaparin (LOVENOX) 40 MG/0.4ML injection Inject 0.4 mLs (40 mg total) into the skin daily.   ibuprofen (ADVIL,MOTRIN) 800 MG tablet Take 1 tablet (800 mg total) by mouth 3 (three) times daily.   loratadine (CLARITIN) 10 MG tablet Take 10 mg by mouth 2 (two) times daily.   Multiple Vitamins-Minerals (MULTIVITAMIN WITH MINERALS) tablet Take 1 tablet by mouth daily.   Omega-3 Fatty Acids (FISH OIL) 1200 MG CAPS Take 1 capsule by mouth daily.   OVER THE COUNTER MEDICATION Take 1 capsule by mouth daily. Curamin   oxyCODONE (OXY IR/ROXICODONE) 5 MG immediate release tablet Take 1-2 tablets (5-10 mg total) by mouth every 3 (three) hours as needed for breakthrough pain.   OxyCODONE (OXYCONTIN) 20 mg T12A 12 hr tablet Take 1 tablet (20 mg total) by mouth every 12 (twelve) hours.   tadalafil (CIALIS) 5 MG tablet Take 5 mg by mouth every other  day.   tiZANidine (ZANAFLEX) 2 MG tablet Take 1 tablet (2 mg total) by mouth every 6 (six) hours as needed.   No current facility-administered medications for this visit. (Other)      REVIEW OF SYSTEMS: ROS   Negative for: Constitutional, Gastrointestinal, Neurological, Skin, Genitourinary, Musculoskeletal, HENT, Endocrine, Cardiovascular, Eyes, Respiratory, Psychiatric, Allergic/Imm, Heme/Lymph Last edited by Silvestre Moment on 09/23/2021  8:14 AM.       ALLERGIES Allergies  Allergen Reactions   Ampicillin Hives   Azithromycin Hives, Itching and Swelling    PAST MEDICAL HISTORY Past Medical History:  Diagnosis Date   Arthritis    Basal cell carcinoma    Cancer ([place])    skin cancer removed   Hearing loss    Snoring    Urticaria    Past Surgical History:  Procedure Laterality Date   COLONOSCOPY W/ POLYPECTOMY     SKIN CANCER EXCISION     neck and head   TOTAL KNEE ARTHROPLASTY Right 10/29/2014   Procedure: TOTAL KNEE ARTHROPLASTY;  Surgeon: Vickey Huger, MD;  Location: [facility];  Service: Orthopedics;  Laterality: Right;    FAMILY HISTORY History reviewed. No pertinent family history.  SOCIAL HISTORY Social History   Tobacco Use   Smoking status: Never   Smokeless tobacco: Never  Substance Use Topics   Alcohol use: Yes    Comment: occasional  Drug use: No         OPHTHALMIC EXAM:  Base Eye Exam     Visual Acuity (ETDRS)       Right Left   Dist cc 20/20 -2 20/20         Tonometry (Tonopen, 8:20 AM)       Right Left   Pressure 14 8         Pupils       Dark Light Shape React APD   Right 3 2 Round Minimal None   Left 3 2 Round Minimal None         Visual Fields       Left Right    Full Full         Extraocular Movement       Right Left    Full Full         Neuro/Psych     Oriented x3: Yes   Mood/Affect: Normal         Dilation     Both eyes: 1.0% Mydriacyl, 2.5% Phenylephrine @ 8:17 AM           Slit Lamp and  Fundus Exam     External Exam       Right Left   External Normal Normal         Slit Lamp Exam       Right Left   Lids/Lashes Normal Normal   Conjunctiva/Sclera White and quiet White and quiet   Cornea Clear Clear   Anterior Chamber Deep and quiet Deep and quiet   Iris Round and reactive Round and reactive   Lens Nuclear sclerosis 2+, central NS, outer cortex clear Nuclear sclerosis 2+, central NS, outer cortex clear   Anterior Vitreous Normal Normal         Fundus Exam       Right Left   Posterior Vitreous Normal Normal   Disc Normal Normal   C/D Ratio 0.3 0.3   Macula Normal Early age related macular degeneration, Retinal pigment epithelial detachment , small, inferotemporal to faz, no fluid, no hemorrhage   Vessels Normal Normal   Periphery Normal Normal            IMAGING AND PROCEDURES  Imaging and Procedures for 09/23/21  OCT, Retina - OU - Both Eyes       Right Eye Quality was good. Scan locations included subfoveal. Central Foveal Thickness: 294. Progression has been stable. Findings include normal foveal contour.   Left Eye Quality was good. Scan locations included subfoveal. Central Foveal Thickness: 302. Findings include vitreomacular adhesion , pigment epithelial detachment, abnormal foveal contour.   Notes No signs of active CNVM OS, will continue to monitor retinal pigment epithelial detachment inferotemporal to the fovea. 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  This could be from the dark coloration changes from his cataract in each eye.  Evaluate as needed  Early stage nonexudative age-related  macular degeneration of both eyes Minor OU D and OS  Vitreomacular adhesion of both eyes Physiologic OU     ICD-10-CM   1. Early stage nonexudative age-related  macular degeneration of both eyes  H35.3131 OCT, Retina - OU - Both Eyes    2. 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 Nuclear sclerotic cataract of both eyes  H25.13     4. Vitreomacular adhesion of both eyes  H43.823       1.  OU stable over time  2.  Cataracts are darkening discussed briefly with patient he will follow-up with Dr. Sharen Counter and coordinate timing of evaluation and/or symptomatic difficulties from his cataract  3.  No signs of wet AMD progression OU  Ophthalmic Meds Ordered this visit:  No orders of the defined types were placed in this encounter.      Return in about 9 months (around 06/25/2022) for DILATE OU, COLOR FP, OCT.  There are no Patient Instructions on file for this visit.   Explained the diagnoses, plan, and follow up with the patient and they expressed understanding.  Patient expressed understanding of the importance of proper follow up care.   Clent Demark Allsion Nogales M.D. Diseases & Surgery of the Retina and Vitreous [facility] [place] 09/23/21     Abbreviations: M myopia (nearsighted); A astigmatism; H hyperopia (farsighted); P presbyopia; Mrx spectacle prescription;  CTL contact lenses; OD right eye; OS left eye; OU both eyes  XT exotropia; ET esotropia; PEK punctate epithelial keratitis; PEE punctate epithelial erosions; DES dry eye syndrome; MGD meibomian gland dysfunction; ATs artificial tears; PFAT's preservative free artificial tears; [place] nuclear sclerotic cataract; PSC posterior subcapsular cataract; ERM epi-retinal membrane; PVD posterior vitreous detachment; RD retinal detachment; DM diabetes mellitus; DR diabetic retinopathy; NPDR non-proliferative diabetic retinopathy; PDR proliferative diabetic retinopathy; CSME clinically significant macular edema;  DME diabetic macular edema; dbh dot blot hemorrhages; CWS cotton wool spot; POAG primary open angle glaucoma; C/D cup-to-disc ratio; HVF humphrey visual field; GVF goldmann visual field; OCT optical coherence tomography; IOP intraocular pressure; BRVO Branch retinal vein occlusion; CRVO central retinal vein occlusion; CRAO central retinal artery occlusion; BRAO branch retinal artery occlusion; RT retinal tear; SB scleral buckle; PPV pars plana vitrectomy; VH Vitreous hemorrhage; PRP panretinal laser photocoagulation; IVK intravitreal kenalog; VMT vitreomacular traction; MH Macular hole;  NVD neovascularization of the disc; NVE neovascularization elsewhere; AREDS age related eye disease study; ARMD age related macular degeneration; POAG primary open angle glaucoma; EBMD epithelial/anterior basement membrane dystrophy; ACIOL anterior chamber intraocular lens; IOL intraocular lens; PCIOL posterior chamber intraocular lens; Phaco/IOL phacoemulsification with intraocular lens placement; [place] photorefractive keratectomy; LASIK laser assisted in situ keratomileusis; HTN hypertension; DM diabetes mellitus; COPD chronic obstructive pulmonary disease

## 2021-11-10 DIAGNOSIS — M50321 Other cervical disc degeneration at C4-C5 level: Secondary | ICD-10-CM | POA: Diagnosis not present

## 2021-11-10 DIAGNOSIS — M9901 Segmental and somatic dysfunction of cervical region: Secondary | ICD-10-CM | POA: Diagnosis not present

## 2022-01-05 DIAGNOSIS — M9901 Segmental and somatic dysfunction of cervical region: Secondary | ICD-10-CM | POA: Diagnosis not present

## 2022-01-05 DIAGNOSIS — M50321 Other cervical disc degeneration at C4-C5 level: Secondary | ICD-10-CM | POA: Diagnosis not present

## 2022-02-16 DIAGNOSIS — M9902 Segmental and somatic dysfunction of thoracic region: Secondary | ICD-10-CM | POA: Diagnosis not present

## 2022-02-16 DIAGNOSIS — M9903 Segmental and somatic dysfunction of lumbar region: Secondary | ICD-10-CM | POA: Diagnosis not present

## 2022-02-16 DIAGNOSIS — M546 Pain in thoracic spine: Secondary | ICD-10-CM | POA: Diagnosis not present

## 2022-02-16 DIAGNOSIS — M5136 Other intervertebral disc degeneration, lumbar region: Secondary | ICD-10-CM | POA: Diagnosis not present

## 2022-02-17 DIAGNOSIS — M5136 Other intervertebral disc degeneration, lumbar region: Secondary | ICD-10-CM | POA: Diagnosis not present

## 2022-02-17 DIAGNOSIS — M9903 Segmental and somatic dysfunction of lumbar region: Secondary | ICD-10-CM | POA: Diagnosis not present

## 2022-02-17 DIAGNOSIS — M9902 Segmental and somatic dysfunction of thoracic region: Secondary | ICD-10-CM | POA: Diagnosis not present

## 2022-02-17 DIAGNOSIS — M546 Pain in thoracic spine: Secondary | ICD-10-CM | POA: Diagnosis not present

## 2022-02-19 DIAGNOSIS — M9902 Segmental and somatic dysfunction of thoracic region: Secondary | ICD-10-CM | POA: Diagnosis not present

## 2022-02-19 DIAGNOSIS — M9903 Segmental and somatic dysfunction of lumbar region: Secondary | ICD-10-CM | POA: Diagnosis not present

## 2022-02-19 DIAGNOSIS — M5136 Other intervertebral disc degeneration, lumbar region: Secondary | ICD-10-CM | POA: Diagnosis not present

## 2022-02-19 DIAGNOSIS — M546 Pain in thoracic spine: Secondary | ICD-10-CM | POA: Diagnosis not present

## 2022-03-09 DIAGNOSIS — M5136 Other intervertebral disc degeneration, lumbar region: Secondary | ICD-10-CM | POA: Diagnosis not present

## 2022-03-09 DIAGNOSIS — M546 Pain in thoracic spine: Secondary | ICD-10-CM | POA: Diagnosis not present

## 2022-03-09 DIAGNOSIS — M9903 Segmental and somatic dysfunction of lumbar region: Secondary | ICD-10-CM | POA: Diagnosis not present

## 2022-03-09 DIAGNOSIS — M9902 Segmental and somatic dysfunction of thoracic region: Secondary | ICD-10-CM | POA: Diagnosis not present

## 2022-05-05 DIAGNOSIS — D2272 Melanocytic nevi of left lower limb, including hip: Secondary | ICD-10-CM | POA: Diagnosis not present

## 2022-05-05 DIAGNOSIS — D044 Carcinoma in situ of skin of scalp and neck: Secondary | ICD-10-CM | POA: Diagnosis not present

## 2022-05-05 DIAGNOSIS — L570 Actinic keratosis: Secondary | ICD-10-CM | POA: Diagnosis not present

## 2022-05-05 DIAGNOSIS — L820 Inflamed seborrheic keratosis: Secondary | ICD-10-CM | POA: Diagnosis not present

## 2022-05-05 DIAGNOSIS — L821 Other seborrheic keratosis: Secondary | ICD-10-CM | POA: Diagnosis not present

## 2022-05-05 DIAGNOSIS — Z85828 Personal history of other malignant neoplasm of skin: Secondary | ICD-10-CM | POA: Diagnosis not present

## 2022-05-05 DIAGNOSIS — C44629 Squamous cell carcinoma of skin of left upper limb, including shoulder: Secondary | ICD-10-CM | POA: Diagnosis not present

## 2022-05-05 DIAGNOSIS — D2261 Melanocytic nevi of right upper limb, including shoulder: Secondary | ICD-10-CM | POA: Diagnosis not present

## 2022-05-05 DIAGNOSIS — D0462 Carcinoma in situ of skin of left upper limb, including shoulder: Secondary | ICD-10-CM | POA: Diagnosis not present

## 2022-05-05 DIAGNOSIS — D1801 Hemangioma of skin and subcutaneous tissue: Secondary | ICD-10-CM | POA: Diagnosis not present

## 2022-06-25 ENCOUNTER — Encounter (INDEPENDENT_AMBULATORY_CARE_PROVIDER_SITE_OTHER): Payer: Medicare Other | Admitting: Ophthalmology

## 2022-06-25 DIAGNOSIS — H353131 Nonexudative age-related macular degeneration, bilateral, early dry stage: Secondary | ICD-10-CM | POA: Diagnosis not present

## 2022-06-25 DIAGNOSIS — H35722 Serous detachment of retinal pigment epithelium, left eye: Secondary | ICD-10-CM | POA: Diagnosis not present

## 2022-06-25 DIAGNOSIS — H2513 Age-related nuclear cataract, bilateral: Secondary | ICD-10-CM | POA: Diagnosis not present

## 2022-06-25 DIAGNOSIS — H43823 Vitreomacular adhesion, bilateral: Secondary | ICD-10-CM | POA: Diagnosis not present

## 2022-07-27 DIAGNOSIS — M9901 Segmental and somatic dysfunction of cervical region: Secondary | ICD-10-CM | POA: Diagnosis not present

## 2022-07-27 DIAGNOSIS — M50322 Other cervical disc degeneration at C5-C6 level: Secondary | ICD-10-CM | POA: Diagnosis not present

## 2022-08-24 DIAGNOSIS — M9901 Segmental and somatic dysfunction of cervical region: Secondary | ICD-10-CM | POA: Diagnosis not present

## 2022-08-24 DIAGNOSIS — M50322 Other cervical disc degeneration at C5-C6 level: Secondary | ICD-10-CM | POA: Diagnosis not present

## 2022-08-27 DIAGNOSIS — Z0001 Encounter for general adult medical examination with abnormal findings: Secondary | ICD-10-CM | POA: Diagnosis not present

## 2022-08-27 DIAGNOSIS — E7800 Pure hypercholesterolemia, unspecified: Secondary | ICD-10-CM | POA: Diagnosis not present

## 2022-08-27 DIAGNOSIS — Z79899 Other long term (current) drug therapy: Secondary | ICD-10-CM | POA: Diagnosis not present

## 2022-09-21 DIAGNOSIS — M9901 Segmental and somatic dysfunction of cervical region: Secondary | ICD-10-CM | POA: Diagnosis not present

## 2022-09-21 DIAGNOSIS — M50322 Other cervical disc degeneration at C5-C6 level: Secondary | ICD-10-CM | POA: Diagnosis not present

## 2022-10-19 DIAGNOSIS — M9905 Segmental and somatic dysfunction of pelvic region: Secondary | ICD-10-CM | POA: Diagnosis not present

## 2022-10-19 DIAGNOSIS — M5136 Other intervertebral disc degeneration, lumbar region: Secondary | ICD-10-CM | POA: Diagnosis not present

## 2022-10-19 DIAGNOSIS — M9904 Segmental and somatic dysfunction of sacral region: Secondary | ICD-10-CM | POA: Diagnosis not present

## 2022-10-19 DIAGNOSIS — M9903 Segmental and somatic dysfunction of lumbar region: Secondary | ICD-10-CM | POA: Diagnosis not present

## 2022-10-27 DIAGNOSIS — M9904 Segmental and somatic dysfunction of sacral region: Secondary | ICD-10-CM | POA: Diagnosis not present

## 2022-10-27 DIAGNOSIS — M5136 Other intervertebral disc degeneration, lumbar region: Secondary | ICD-10-CM | POA: Diagnosis not present

## 2022-10-27 DIAGNOSIS — M9905 Segmental and somatic dysfunction of pelvic region: Secondary | ICD-10-CM | POA: Diagnosis not present

## 2022-10-27 DIAGNOSIS — M9903 Segmental and somatic dysfunction of lumbar region: Secondary | ICD-10-CM | POA: Diagnosis not present

## 2022-10-29 DIAGNOSIS — M5136 Other intervertebral disc degeneration, lumbar region: Secondary | ICD-10-CM | POA: Diagnosis not present

## 2022-10-29 DIAGNOSIS — M9903 Segmental and somatic dysfunction of lumbar region: Secondary | ICD-10-CM | POA: Diagnosis not present

## 2022-10-29 DIAGNOSIS — M9904 Segmental and somatic dysfunction of sacral region: Secondary | ICD-10-CM | POA: Diagnosis not present

## 2022-10-29 DIAGNOSIS — M9905 Segmental and somatic dysfunction of pelvic region: Secondary | ICD-10-CM | POA: Diagnosis not present

## 2022-11-02 DIAGNOSIS — M9905 Segmental and somatic dysfunction of pelvic region: Secondary | ICD-10-CM | POA: Diagnosis not present

## 2022-11-02 DIAGNOSIS — M5136 Other intervertebral disc degeneration, lumbar region: Secondary | ICD-10-CM | POA: Diagnosis not present

## 2022-11-02 DIAGNOSIS — M9904 Segmental and somatic dysfunction of sacral region: Secondary | ICD-10-CM | POA: Diagnosis not present

## 2022-11-02 DIAGNOSIS — M9903 Segmental and somatic dysfunction of lumbar region: Secondary | ICD-10-CM | POA: Diagnosis not present

## 2022-11-04 DIAGNOSIS — M9905 Segmental and somatic dysfunction of pelvic region: Secondary | ICD-10-CM | POA: Diagnosis not present

## 2022-11-04 DIAGNOSIS — M5136 Other intervertebral disc degeneration, lumbar region: Secondary | ICD-10-CM | POA: Diagnosis not present

## 2022-11-04 DIAGNOSIS — M9903 Segmental and somatic dysfunction of lumbar region: Secondary | ICD-10-CM | POA: Diagnosis not present

## 2022-11-04 DIAGNOSIS — M9904 Segmental and somatic dysfunction of sacral region: Secondary | ICD-10-CM | POA: Diagnosis not present

## 2022-11-09 DIAGNOSIS — M9905 Segmental and somatic dysfunction of pelvic region: Secondary | ICD-10-CM | POA: Diagnosis not present

## 2022-11-09 DIAGNOSIS — M9904 Segmental and somatic dysfunction of sacral region: Secondary | ICD-10-CM | POA: Diagnosis not present

## 2022-11-09 DIAGNOSIS — M5136 Other intervertebral disc degeneration, lumbar region: Secondary | ICD-10-CM | POA: Diagnosis not present

## 2022-11-09 DIAGNOSIS — M9903 Segmental and somatic dysfunction of lumbar region: Secondary | ICD-10-CM | POA: Diagnosis not present

## 2022-11-12 DIAGNOSIS — M9904 Segmental and somatic dysfunction of sacral region: Secondary | ICD-10-CM | POA: Diagnosis not present

## 2022-11-12 DIAGNOSIS — M9905 Segmental and somatic dysfunction of pelvic region: Secondary | ICD-10-CM | POA: Diagnosis not present

## 2022-11-12 DIAGNOSIS — M9903 Segmental and somatic dysfunction of lumbar region: Secondary | ICD-10-CM | POA: Diagnosis not present

## 2022-11-12 DIAGNOSIS — M5136 Other intervertebral disc degeneration, lumbar region: Secondary | ICD-10-CM | POA: Diagnosis not present

## 2022-11-16 DIAGNOSIS — M5136 Other intervertebral disc degeneration, lumbar region: Secondary | ICD-10-CM | POA: Diagnosis not present

## 2022-11-16 DIAGNOSIS — M9905 Segmental and somatic dysfunction of pelvic region: Secondary | ICD-10-CM | POA: Diagnosis not present

## 2022-11-16 DIAGNOSIS — M9904 Segmental and somatic dysfunction of sacral region: Secondary | ICD-10-CM | POA: Diagnosis not present

## 2022-11-16 DIAGNOSIS — M9903 Segmental and somatic dysfunction of lumbar region: Secondary | ICD-10-CM | POA: Diagnosis not present

## 2022-11-18 DIAGNOSIS — M9905 Segmental and somatic dysfunction of pelvic region: Secondary | ICD-10-CM | POA: Diagnosis not present

## 2022-11-18 DIAGNOSIS — M5136 Other intervertebral disc degeneration, lumbar region: Secondary | ICD-10-CM | POA: Diagnosis not present

## 2022-11-18 DIAGNOSIS — M9903 Segmental and somatic dysfunction of lumbar region: Secondary | ICD-10-CM | POA: Diagnosis not present

## 2022-11-18 DIAGNOSIS — M9904 Segmental and somatic dysfunction of sacral region: Secondary | ICD-10-CM | POA: Diagnosis not present

## 2022-11-24 DIAGNOSIS — M9904 Segmental and somatic dysfunction of sacral region: Secondary | ICD-10-CM | POA: Diagnosis not present

## 2022-11-24 DIAGNOSIS — M9905 Segmental and somatic dysfunction of pelvic region: Secondary | ICD-10-CM | POA: Diagnosis not present

## 2022-11-24 DIAGNOSIS — M5136 Other intervertebral disc degeneration, lumbar region: Secondary | ICD-10-CM | POA: Diagnosis not present

## 2022-11-24 DIAGNOSIS — M9903 Segmental and somatic dysfunction of lumbar region: Secondary | ICD-10-CM | POA: Diagnosis not present

## 2022-11-26 DIAGNOSIS — M9904 Segmental and somatic dysfunction of sacral region: Secondary | ICD-10-CM | POA: Diagnosis not present

## 2022-11-26 DIAGNOSIS — M5136 Other intervertebral disc degeneration, lumbar region: Secondary | ICD-10-CM | POA: Diagnosis not present

## 2022-11-26 DIAGNOSIS — M9905 Segmental and somatic dysfunction of pelvic region: Secondary | ICD-10-CM | POA: Diagnosis not present

## 2022-11-26 DIAGNOSIS — M9903 Segmental and somatic dysfunction of lumbar region: Secondary | ICD-10-CM | POA: Diagnosis not present

## 2022-12-01 DIAGNOSIS — M9905 Segmental and somatic dysfunction of pelvic region: Secondary | ICD-10-CM | POA: Diagnosis not present

## 2022-12-01 DIAGNOSIS — M9904 Segmental and somatic dysfunction of sacral region: Secondary | ICD-10-CM | POA: Diagnosis not present

## 2022-12-01 DIAGNOSIS — M9903 Segmental and somatic dysfunction of lumbar region: Secondary | ICD-10-CM | POA: Diagnosis not present

## 2022-12-01 DIAGNOSIS — M5136 Other intervertebral disc degeneration, lumbar region: Secondary | ICD-10-CM | POA: Diagnosis not present

## 2022-12-15 DIAGNOSIS — M9903 Segmental and somatic dysfunction of lumbar region: Secondary | ICD-10-CM | POA: Diagnosis not present

## 2022-12-15 DIAGNOSIS — M5136 Other intervertebral disc degeneration, lumbar region: Secondary | ICD-10-CM | POA: Diagnosis not present

## 2022-12-15 DIAGNOSIS — M9905 Segmental and somatic dysfunction of pelvic region: Secondary | ICD-10-CM | POA: Diagnosis not present

## 2022-12-15 DIAGNOSIS — M9904 Segmental and somatic dysfunction of sacral region: Secondary | ICD-10-CM | POA: Diagnosis not present

## 2023-01-12 DIAGNOSIS — M9904 Segmental and somatic dysfunction of sacral region: Secondary | ICD-10-CM | POA: Diagnosis not present

## 2023-01-12 DIAGNOSIS — M5136 Other intervertebral disc degeneration, lumbar region: Secondary | ICD-10-CM | POA: Diagnosis not present

## 2023-01-12 DIAGNOSIS — M9905 Segmental and somatic dysfunction of pelvic region: Secondary | ICD-10-CM | POA: Diagnosis not present

## 2023-01-12 DIAGNOSIS — M9903 Segmental and somatic dysfunction of lumbar region: Secondary | ICD-10-CM | POA: Diagnosis not present

## 2023-02-16 DIAGNOSIS — M9905 Segmental and somatic dysfunction of pelvic region: Secondary | ICD-10-CM | POA: Diagnosis not present

## 2023-02-16 DIAGNOSIS — M5136 Other intervertebral disc degeneration, lumbar region: Secondary | ICD-10-CM | POA: Diagnosis not present

## 2023-02-16 DIAGNOSIS — M9903 Segmental and somatic dysfunction of lumbar region: Secondary | ICD-10-CM | POA: Diagnosis not present

## 2023-02-16 DIAGNOSIS — M9904 Segmental and somatic dysfunction of sacral region: Secondary | ICD-10-CM | POA: Diagnosis not present

## 2023-02-17 DIAGNOSIS — K420 Umbilical hernia with obstruction, without gangrene: Secondary | ICD-10-CM | POA: Diagnosis not present

## 2023-03-10 DIAGNOSIS — K429 Umbilical hernia without obstruction or gangrene: Secondary | ICD-10-CM | POA: Diagnosis not present

## 2023-03-15 DIAGNOSIS — M9902 Segmental and somatic dysfunction of thoracic region: Secondary | ICD-10-CM | POA: Diagnosis not present

## 2023-03-15 DIAGNOSIS — G4486 Cervicogenic headache: Secondary | ICD-10-CM | POA: Diagnosis not present

## 2023-03-15 DIAGNOSIS — M9903 Segmental and somatic dysfunction of lumbar region: Secondary | ICD-10-CM | POA: Diagnosis not present

## 2023-03-15 DIAGNOSIS — M9905 Segmental and somatic dysfunction of pelvic region: Secondary | ICD-10-CM | POA: Diagnosis not present

## 2023-03-15 DIAGNOSIS — M542 Cervicalgia: Secondary | ICD-10-CM | POA: Diagnosis not present

## 2023-03-15 DIAGNOSIS — M9901 Segmental and somatic dysfunction of cervical region: Secondary | ICD-10-CM | POA: Diagnosis not present

## 2023-03-18 DIAGNOSIS — M9901 Segmental and somatic dysfunction of cervical region: Secondary | ICD-10-CM | POA: Diagnosis not present

## 2023-03-18 DIAGNOSIS — M9905 Segmental and somatic dysfunction of pelvic region: Secondary | ICD-10-CM | POA: Diagnosis not present

## 2023-03-18 DIAGNOSIS — M9903 Segmental and somatic dysfunction of lumbar region: Secondary | ICD-10-CM | POA: Diagnosis not present

## 2023-03-18 DIAGNOSIS — M9902 Segmental and somatic dysfunction of thoracic region: Secondary | ICD-10-CM | POA: Diagnosis not present

## 2023-03-23 DIAGNOSIS — G4486 Cervicogenic headache: Secondary | ICD-10-CM | POA: Diagnosis not present

## 2023-03-23 DIAGNOSIS — M9902 Segmental and somatic dysfunction of thoracic region: Secondary | ICD-10-CM | POA: Diagnosis not present

## 2023-03-23 DIAGNOSIS — M9903 Segmental and somatic dysfunction of lumbar region: Secondary | ICD-10-CM | POA: Diagnosis not present

## 2023-03-23 DIAGNOSIS — M9901 Segmental and somatic dysfunction of cervical region: Secondary | ICD-10-CM | POA: Diagnosis not present

## 2023-03-23 DIAGNOSIS — M9905 Segmental and somatic dysfunction of pelvic region: Secondary | ICD-10-CM | POA: Diagnosis not present

## 2023-03-25 DIAGNOSIS — M9901 Segmental and somatic dysfunction of cervical region: Secondary | ICD-10-CM | POA: Diagnosis not present

## 2023-03-25 DIAGNOSIS — M9903 Segmental and somatic dysfunction of lumbar region: Secondary | ICD-10-CM | POA: Diagnosis not present

## 2023-03-25 DIAGNOSIS — M9905 Segmental and somatic dysfunction of pelvic region: Secondary | ICD-10-CM | POA: Diagnosis not present

## 2023-03-25 DIAGNOSIS — G4486 Cervicogenic headache: Secondary | ICD-10-CM | POA: Diagnosis not present

## 2023-03-25 DIAGNOSIS — M9902 Segmental and somatic dysfunction of thoracic region: Secondary | ICD-10-CM | POA: Diagnosis not present

## 2023-04-13 NOTE — Progress Notes (Signed)
Sent message, via epic in basket, requesting orders in epic from surgeon.  

## 2023-04-16 DIAGNOSIS — M9901 Segmental and somatic dysfunction of cervical region: Secondary | ICD-10-CM | POA: Diagnosis not present

## 2023-04-16 DIAGNOSIS — M9902 Segmental and somatic dysfunction of thoracic region: Secondary | ICD-10-CM | POA: Diagnosis not present

## 2023-04-16 DIAGNOSIS — M9903 Segmental and somatic dysfunction of lumbar region: Secondary | ICD-10-CM | POA: Diagnosis not present

## 2023-04-16 DIAGNOSIS — M9905 Segmental and somatic dysfunction of pelvic region: Secondary | ICD-10-CM | POA: Diagnosis not present

## 2023-04-16 NOTE — Progress Notes (Signed)
Second request for pre op orders in [facility]: Spoke with Joni Reining

## 2023-04-17 ENCOUNTER — Ambulatory Visit: Payer: Self-pay | Admitting: Surgery

## 2023-04-17 DIAGNOSIS — Z01818 Encounter for other preprocedural examination: Secondary | ICD-10-CM

## 2023-04-19 NOTE — Progress Notes (Signed)
Anesthesia Review:  PCP: Cardiologist : Chest x-ray : EKG : Echo : Stress test: Cardiac Cath :  Activity level:  Sleep Study/ CPAP : Fasting Blood Sugar :      / Checks Blood Sugar -- times a day:   Blood Thinner/ Instructions /Last Dose: ASA / Instructions/ Last Dose :

## 2023-04-19 NOTE — Patient Instructions (Signed)
SURGICAL WAITING ROOM VISITATION  Patients having surgery or a procedure may have no more than 2 support people in the waiting area - these visitors may rotate.    Children under the age of 52 must have an adult with them who is not the patient.  Due to an increase in RSV and influenza rates and associated hospitalizations, children ages 59 and under may not visit patients in [facility] hospitals.  If the patient needs to stay at the hospital during part of their recovery, the visitor guidelines for inpatient rooms apply. Pre-op nurse will coordinate an appropriate time for 1 support person to accompany patient in pre-op.  This support person may not rotate.    Please refer to the [facility] website for the visitor guidelines for Inpatients (after your surgery is over and you are in a regular room).       Your procedure is scheduled on:  04/26/2023    Report to [facility] Main Entrance    Report to admitting at   [phone]   Call this number if you have problems the morning of surgery [phone]   Do not eat food or drink liquids  :After Midnight.                              If you have questions, please contact your surgeon's office.        Oral Hygiene is also important to reduce your risk of infection.                                    Remember - BRUSH YOUR TEETH THE MORNING OF SURGERY WITH YOUR REGULAR TOOTHPASTE  DENTURES WILL BE REMOVED PRIOR TO SURGERY PLEASE DO NOT APPLY "Poly grip" OR ADHESIVES!!!   Do NOT smoke after Midnight   Stop all vitamins and herbal supplements 7 days before surgery.   Take these medicines the morning of surgery with A SIP OF WATER:  none   DO NOT TAKE ANY ORAL DIABETIC MEDICATIONS DAY OF YOUR SURGERY  Bring CPAP mask and tubing day of surgery.                              You may not have any metal on your body including hair pins, jewelry, and body piercing             Do not wear make-up, lotions, powders,  perfumes/cologne, or deodorant  Do not wear nail polish including gel and S&S, artificial/acrylic nails, or any other type of covering on natural nails including finger and toenails. If you have artificial nails, gel coating, etc. that needs to be removed by a nail salon please have this removed prior to surgery or surgery may need to be canceled/ delayed if the surgeon/ anesthesia feels like they are unable to be safely monitored.   Do not shave  48 hours prior to surgery.               Men may shave face and neck.   Do not bring valuables to the hospital. [facility] IS NOT             RESPONSIBLE   FOR VALUABLES.   Contacts, glasses, dentures or bridgework may not be worn into surgery.   Bring small overnight bag day  of surgery.   DO NOT BRING YOUR HOME MEDICATIONS TO THE HOSPITAL. PHARMACY WILL DISPENSE MEDICATIONS LISTED ON YOUR MEDICATION LIST TO YOU DURING YOUR ADMISSION IN THE HOSPITAL!    Patients discharged on the day of surgery will not be allowed to drive home.  Someone NEEDS to stay with you for the first 24 hours after anesthesia.   Special Instructions: Bring a copy of your healthcare power of attorney and living will documents the day of surgery if you haven't scanned them before.              Please read over the following fact sheets you were given: IF YOU HAVE QUESTIONS ABOUT YOUR PRE-OP INSTRUCTIONS PLEASE CALL [phone]   If you received a COVID test during your pre-op visit  it is requested that you wear a mask when out in public, stay away from anyone that may not be feeling well and notify your surgeon if you develop symptoms. If you test positive for Covid or have been in contact with anyone that has tested positive in the last 10 days please notify you surgeon.    [facility] - Preparing for Surgery Before surgery, you can play an important role.  Because skin is not sterile, your skin needs to be as free of germs as possible.  You can reduce the number of  germs on your skin by washing with CHG (chlorahexidine gluconate) soap before surgery.  CHG is an antiseptic cleaner which kills germs and bonds with the skin to continue killing germs even after washing. Please DO NOT use if you have an allergy to CHG or antibacterial soaps.  If your skin becomes reddened/irritated stop using the CHG and inform your nurse when you arrive at Short Stay. Do not shave (including legs and underarms) for at least 48 hours prior to the first CHG shower.  You may shave your face/neck. Please follow these instructions carefully:  1.  Shower with CHG Soap the night before surgery and the  morning of Surgery.  2.  If you choose to wash your hair, wash your hair first as usual with your  normal  shampoo.  3.  After you shampoo, rinse your hair and body thoroughly to remove the  shampoo.                           4.  Use CHG as you would any other liquid soap.  You can apply chg directly  to the skin and wash                       Gently with a scrungie or clean washcloth.  5.  Apply the CHG Soap to your body ONLY FROM THE NECK DOWN.   Do not use on face/ open                           Wound or open sores. Avoid contact with eyes, ears mouth and genitals (private parts).                       Wash face,  Genitals (private parts) with your normal soap.             6.  Wash thoroughly, paying special attention to the area where your surgery  will be performed.  7.  Thoroughly rinse your body with warm water from the  neck down.  8.  DO NOT shower/wash with your normal soap after using and rinsing off  the CHG Soap.                9.  Pat yourself dry with a clean towel.            10.  Wear clean pajamas.            11.  Place clean sheets on your bed the night of your first shower and do not  sleep with pets. Day of Surgery : Do not apply any lotions/deodorants the morning of surgery.  Please wear clean clothes to the hospital/surgery center.  FAILURE TO FOLLOW THESE  INSTRUCTIONS MAY RESULT IN THE CANCELLATION OF YOUR SURGERY PATIENT SIGNATURE_________________________________  NURSE SIGNATURE__________________________________  ________________________________________________________________________

## 2023-04-20 ENCOUNTER — Encounter (HOSPITAL_COMMUNITY): Payer: Self-pay

## 2023-04-20 ENCOUNTER — Other Ambulatory Visit: Payer: Self-pay

## 2023-04-20 ENCOUNTER — Encounter (HOSPITAL_COMMUNITY)
Admission: RE | Admit: 2023-04-20 | Discharge: 2023-04-20 | Disposition: A | Discharge status: Home / Self Care | Payer: Medicare Other | Source: Ambulatory Visit | Attending: Surgery | Admitting: Surgery

## 2023-04-20 VITALS — BP 150/89 | HR 63 | Temp 98.3°F | Resp 16 | Ht 70.5 in | Wt 213.8 lb

## 2023-04-20 DIAGNOSIS — Z01818 Encounter for other preprocedural examination: Secondary | ICD-10-CM

## 2023-04-20 DIAGNOSIS — Z01812 Encounter for preprocedural laboratory examination: Secondary | ICD-10-CM | POA: Diagnosis not present

## 2023-04-20 LAB — COMPREHENSIVE METABOLIC PANEL
ALT: 24 U/L (ref 0–44)
AST: 21 U/L (ref 15–41)
Albumin: 4.5 g/dL (ref 3.5–5.0)
Alkaline Phosphatase: 63 U/L (ref 38–126)
Anion gap: 7 (ref 5–15)
BUN: 15 mg/dL (ref 8–23)
CO2: 26 mmol/L (ref 22–32)
Calcium: 9.1 mg/dL (ref 8.9–10.3)
Chloride: 105 mmol/L (ref 98–111)
Creatinine, Ser: 1.02 mg/dL (ref 0.61–1.24)
GFR, Estimated: >60 mL/min (ref >60)
Glucose, Bld: 99 mg/dL (ref 70–99)
Potassium: 4.3 mmol/L (ref 3.5–5.1)
Sodium: 138 mmol/L (ref 135–145)
Total Bilirubin: 0.9 mg/dL (ref 0.3–1.2)
Total Protein: 7.4 g/dL (ref 6.5–8.1)

## 2023-04-20 LAB — CBC WITH DIFFERENTIAL/PLATELET
Abs Immature Granulocytes: 0.02 10*3/uL (ref 0.00–0.07)
Basophils Absolute: 0 10*3/uL (ref 0.0–0.1)
Basophils Relative: 0 %
Eosinophils Absolute: 0.1 10*3/uL (ref 0.0–0.5)
Eosinophils Relative: 1 %
HCT: 47.4 % (ref 39.0–52.0)
Hemoglobin: 15.7 g/dL (ref 13.0–17.0)
Immature Granulocytes: 0 %
Lymphocytes Relative: 36 %
Lymphs Abs: 2.7 10*3/uL (ref 0.7–4.0)
MCH: 31 pg (ref 26.0–34.0)
MCHC: 33.1 g/dL (ref 30.0–36.0)
MCV: 93.5 fL (ref 80.0–100.0)
Monocytes Absolute: 0.6 10*3/uL (ref 0.1–1.0)
Monocytes Relative: 8 %
Neutro Abs: 4.1 10*3/uL (ref 1.7–7.7)
Neutrophils Relative %: 55 %
Platelets: 209 10*3/uL (ref 150–400)
RBC: 5.07 MIL/uL (ref 4.22–5.81)
RDW: 14.1 % (ref 11.5–15.5)
WBC: 7.6 10*3/uL (ref 4.0–10.5)
nRBC: 0 % (ref 0.0–0.2)

## 2023-04-23 DIAGNOSIS — M9903 Segmental and somatic dysfunction of lumbar region: Secondary | ICD-10-CM | POA: Diagnosis not present

## 2023-04-23 DIAGNOSIS — M9901 Segmental and somatic dysfunction of cervical region: Secondary | ICD-10-CM | POA: Diagnosis not present

## 2023-04-23 DIAGNOSIS — M9902 Segmental and somatic dysfunction of thoracic region: Secondary | ICD-10-CM | POA: Diagnosis not present

## 2023-04-23 DIAGNOSIS — M9905 Segmental and somatic dysfunction of pelvic region: Secondary | ICD-10-CM | POA: Diagnosis not present

## 2023-04-26 ENCOUNTER — Ambulatory Visit (HOSPITAL_COMMUNITY)
Admission: RE | Admit: 2023-04-26 | Discharge: 2023-04-26 | Disposition: A | Discharge status: Home / Self Care | Payer: Medicare Other | Attending: Surgery | Admitting: Surgery

## 2023-04-26 ENCOUNTER — Encounter (HOSPITAL_COMMUNITY)
Admission: RE | Disposition: A | Discharge status: Home / Self Care | Payer: Self-pay | Source: Home / Self Care | Attending: Surgery

## 2023-04-26 ENCOUNTER — Encounter (HOSPITAL_COMMUNITY): Payer: Self-pay | Admitting: Surgery

## 2023-04-26 ENCOUNTER — Ambulatory Visit (HOSPITAL_COMMUNITY): Payer: Medicare Other | Admitting: Anesthesiology

## 2023-04-26 ENCOUNTER — Ambulatory Visit (HOSPITAL_BASED_OUTPATIENT_CLINIC_OR_DEPARTMENT_OTHER): Payer: Medicare Other | Admitting: Anesthesiology

## 2023-04-26 DIAGNOSIS — K429 Umbilical hernia without obstruction or gangrene: Secondary | ICD-10-CM | POA: Diagnosis not present

## 2023-04-26 DIAGNOSIS — Z01818 Encounter for other preprocedural examination: Secondary | ICD-10-CM

## 2023-04-26 HISTORY — PX: UMBILICAL HERNIA REPAIR: SHX196

## 2023-04-26 LAB — CBC
HCT: 42.8 % (ref 39.0–52.0)
Hemoglobin: 14.2 g/dL (ref 13.0–17.0)
MCH: 31.8 pg (ref 26.0–34.0)
MCHC: 33.2 g/dL (ref 30.0–36.0)
MCV: 95.7 fL (ref 80.0–100.0)
Platelets: 185 10*3/uL (ref 150–400)
RBC: 4.47 MIL/uL (ref 4.22–5.81)
RDW: 14.1 % (ref 11.5–15.5)
WBC: 6.9 10*3/uL (ref 4.0–10.5)
nRBC: 0 % (ref 0.0–0.2)

## 2023-04-26 SURGERY — REPAIR, HERNIA, UMBILICAL, ADULT
Anesthesia: General

## 2023-04-26 MED ORDER — TRAMADOL HCL 50 MG PO TABS
50.0000 mg | ORAL_TABLET | Freq: Four times a day (QID) | ORAL | 0 refills | Status: AC | PRN
Start: 2023-04-26 — End: 2023-05-01

## 2023-04-26 MED ORDER — PHENYLEPHRINE 80 MCG/ML (10ML) SYRINGE FOR IV PUSH (FOR BLOOD PRESSURE SUPPORT)
PREFILLED_SYRINGE | INTRAVENOUS | Status: DC | PRN
  Administered 2023-04-26 (×3): 80 ug via INTRAVENOUS

## 2023-04-26 MED ORDER — LIDOCAINE HCL (CARDIAC) PF 100 MG/5ML IV SOSY
PREFILLED_SYRINGE | INTRAVENOUS | Status: DC | PRN
  Administered 2023-04-26: 60 mg via INTRAVENOUS

## 2023-04-26 MED ORDER — FENTANYL CITRATE (PF) 100 MCG/2ML IJ SOLN
INTRAMUSCULAR | Status: AC
  Filled 2023-04-26: qty 2

## 2023-04-26 MED ORDER — OXYCODONE HCL 5 MG/5ML PO SOLN: 5.0000 mg | Freq: Once | ORAL | Status: DC | PRN

## 2023-04-26 MED ORDER — BUPIVACAINE LIPOSOME 1.3 % IJ SUSP: 20.0000 mL | Freq: Once | INTRAMUSCULAR | Status: DC

## 2023-04-26 MED ORDER — ONDANSETRON HCL 4 MG/2ML IJ SOLN
INTRAMUSCULAR | Status: AC
  Filled 2023-04-26: qty 2

## 2023-04-26 MED ORDER — HYDROMORPHONE HCL 1 MG/ML IJ SOLN: 0.2500 mg | INTRAMUSCULAR | Status: DC | PRN

## 2023-04-26 MED ORDER — AMISULPRIDE (ANTIEMETIC) 5 MG/2ML IV SOLN: 10.0000 mg | Freq: Once | INTRAVENOUS | Status: DC | PRN

## 2023-04-26 MED ORDER — BUPIVACAINE-EPINEPHRINE 0.25% -1:200000 IJ SOLN
INTRAMUSCULAR | Status: DC | PRN
  Administered 2023-04-26: 20 mL

## 2023-04-26 MED ORDER — ROCURONIUM BROMIDE 10 MG/ML (PF) SYRINGE
PREFILLED_SYRINGE | INTRAVENOUS | Status: AC
  Filled 2023-04-26: qty 10

## 2023-04-26 MED ORDER — CHLORHEXIDINE GLUCONATE CLOTH 2 % EX PADS: 6.0000 | MEDICATED_PAD | Freq: Once | CUTANEOUS | Status: DC

## 2023-04-26 MED ORDER — FENTANYL CITRATE (PF) 100 MCG/2ML IJ SOLN
INTRAMUSCULAR | Status: DC | PRN
  Administered 2023-04-26 (×2): 50 ug via INTRAVENOUS

## 2023-04-26 MED ORDER — MIDAZOLAM HCL 2 MG/2ML IJ SOLN
INTRAMUSCULAR | Status: DC | PRN
  Administered 2023-04-26: 2 mg via INTRAVENOUS

## 2023-04-26 MED ORDER — MIDAZOLAM HCL 2 MG/2ML IJ SOLN
INTRAMUSCULAR | Status: AC
  Filled 2023-04-26: qty 2

## 2023-04-26 MED ORDER — ONDANSETRON HCL 4 MG/2ML IJ SOLN
INTRAMUSCULAR | Status: DC | PRN
  Administered 2023-04-26: 4 mg via INTRAVENOUS

## 2023-04-26 MED ORDER — BUPIVACAINE LIPOSOME 1.3 % IJ SUSP
INTRAMUSCULAR | Status: DC | PRN
  Administered 2023-04-26: 10 mL

## 2023-04-26 MED ORDER — LACTATED RINGERS IV SOLN: INTRAVENOUS | Status: DC

## 2023-04-26 MED ORDER — ACETAMINOPHEN 500 MG PO TABS: 1000.0000 mg | ORAL_TABLET | ORAL | Status: DC

## 2023-04-26 MED ORDER — OXYCODONE HCL 5 MG PO TABS: 5.0000 mg | ORAL_TABLET | Freq: Once | ORAL | Status: DC | PRN

## 2023-04-26 MED ORDER — PROPOFOL 10 MG/ML IV BOLUS
INTRAVENOUS | Status: DC | PRN
  Administered 2023-04-26: 150 mg via INTRAVENOUS

## 2023-04-26 MED ORDER — ACETAMINOPHEN 500 MG PO TABS
1000.0000 mg | ORAL_TABLET | Freq: Once | ORAL | Status: AC
  Administered 2023-04-26: 1000 mg via ORAL
  Filled 2023-04-26: qty 2

## 2023-04-26 MED ORDER — DEXAMETHASONE SODIUM PHOSPHATE 10 MG/ML IJ SOLN
INTRAMUSCULAR | Status: AC
  Filled 2023-04-26: qty 1

## 2023-04-26 MED ORDER — SUGAMMADEX SODIUM 200 MG/2ML IV SOLN
INTRAVENOUS | Status: DC | PRN
  Administered 2023-04-26: 200 mg via INTRAVENOUS

## 2023-04-26 MED ORDER — BUPIVACAINE LIPOSOME 1.3 % IJ SUSP
INTRAMUSCULAR | Status: AC
  Filled 2023-04-26: qty 20

## 2023-04-26 MED ORDER — PHENYLEPHRINE HCL (PRESSORS) 10 MG/ML IV SOLN
INTRAVENOUS | Status: DC | PRN
  Administered 2023-04-26: 80 ug via INTRAVENOUS

## 2023-04-26 MED ORDER — LIDOCAINE HCL (PF) 2 % IJ SOLN
INTRAMUSCULAR | Status: AC
  Filled 2023-04-26: qty 5

## 2023-04-26 MED ORDER — BUPIVACAINE-EPINEPHRINE 0.25% -1:200000 IJ SOLN
INTRAMUSCULAR | Status: AC
  Filled 2023-04-26: qty 1

## 2023-04-26 MED ORDER — PROPOFOL 10 MG/ML IV BOLUS
INTRAVENOUS | Status: AC
  Filled 2023-04-26: qty 20

## 2023-04-26 MED ORDER — DEXAMETHASONE SODIUM PHOSPHATE 10 MG/ML IJ SOLN
INTRAMUSCULAR | Status: DC | PRN
  Administered 2023-04-26: 10 mg via INTRAVENOUS

## 2023-04-26 MED ORDER — ROCURONIUM BROMIDE 100 MG/10ML IV SOLN
INTRAVENOUS | Status: DC | PRN
Start: 2023-04-26 — End: 2023-04-26
  Administered 2023-04-26: 60 mg via INTRAVENOUS

## 2023-04-26 MED ORDER — ONDANSETRON HCL 4 MG/2ML IJ SOLN: 4.0000 mg | Freq: Once | INTRAMUSCULAR | Status: DC | PRN

## 2023-04-26 MED ORDER — SODIUM CHLORIDE 0.9 % IV SOLN
1.0000 g | INTRAVENOUS | Status: AC
  Administered 2023-04-26: 1 g via INTRAVENOUS
  Filled 2023-04-26: qty 1000

## 2023-04-26 MED ORDER — 0.9 % SODIUM CHLORIDE (POUR BTL) OPTIME
TOPICAL | Status: DC | PRN
Start: 2023-04-26 — End: 2023-04-26
  Administered 2023-04-26: 1000 mL

## 2023-04-26 SURGICAL SUPPLY — 25 items
ADH SKN CLS APL DERMABOND .7 (GAUZE/BANDAGES/DRESSINGS) ×1
APL PRP STRL LF DISP 70% ISPRP (MISCELLANEOUS) ×1
BAG COUNTER SPONGE SURGICOUNT (BAG) IMPLANT
BAG SPNG CNTER NS LX DISP (BAG)
CHLORAPREP W/TINT 26 (MISCELLANEOUS) ×1 IMPLANT
COVER SURGICAL LIGHT HANDLE (MISCELLANEOUS) ×1 IMPLANT
DERMABOND ADVANCED .7 DNX12 (GAUZE/BANDAGES/DRESSINGS) ×1 IMPLANT
DRAPE LAPAROSCOPIC ABDOMINAL (DRAPES) ×1 IMPLANT
GLOVE BIO SURGEON STRL SZ7.5 (GLOVE) ×1 IMPLANT
GLOVE INDICATOR 8.0 STRL GRN (GLOVE) ×1 IMPLANT
GOWN STRL REUS W/ TWL XL LVL3 (GOWN DISPOSABLE) ×2 IMPLANT
GOWN STRL REUS W/TWL XL LVL3 (GOWN DISPOSABLE) ×2
KIT BASIN OR (CUSTOM PROCEDURE TRAY) ×1 IMPLANT
NDL HYPO 22X1.5 SAFETY MO (MISCELLANEOUS) IMPLANT
NEEDLE HYPO 22X1.5 SAFETY MO (MISCELLANEOUS) ×1
NS IRRIG 1000ML POUR BTL (IV SOLUTION) ×1 IMPLANT
PACK GENERAL/GYN (CUSTOM PROCEDURE TRAY) ×1 IMPLANT
SPIKE FLUID TRANSFER (MISCELLANEOUS) ×1 IMPLANT
SUT ETHIBOND 0 MO6 C/R (SUTURE) ×1 IMPLANT
SUT MNCRL AB 4-0 PS2 18 (SUTURE) IMPLANT
SUT VIC AB 3-0 SH 27 (SUTURE)
SUT VIC AB 3-0 SH 27X BRD (SUTURE) IMPLANT
SYR 20ML LL LF (SYRINGE) IMPLANT
TOWEL OR 17X26 10 PK STRL BLUE (TOWEL DISPOSABLE) ×1 IMPLANT
TOWEL OR NON WOVEN STRL DISP B (DISPOSABLE) ×1 IMPLANT

## 2023-04-26 NOTE — Op Note (Signed)
04/26/2023  8:44 AM  PATIENT:  Angel Boyd  77 y.o. male  Patient Care Team: Darrow Bussing, MD as PCP - General (Family Medicine)  PRE-OPERATIVE DIAGNOSIS:  UMBILICAL HERNIA  POST-OPERATIVE DIAGNOSIS:  UMBILICAL HERNIA  PROCEDURE:  Open umbilical hernia repair without mesh  SURGEON:  Stephanie Coup. Mignon Bechler, MD  ANESTHESIA:   local and general  COUNTS:  Sponge, needle and instrument counts were reported correct x2 at the conclusion of the operation.  EBL: 2 mL  DRAINS: None  SPECIMEN: None  COMPLICATIONS: None  FINDINGS: Primary umbilical hernia containing preperitoneal fat, approximately 1 cm in size.  Very relaxed fascia.  It was felt that a primary umbilical hernia repair with suture would be the best repair option for him.  DISPOSITION: PACU in satisfactory condition  DESCRIPTION: The patient was identified in preop holding and taken to the OR where he was placed on the operating room table and SCDs were placed. General endotracheal anesthesia was induced without difficulty. He was then prepped and draped in the usual sterile fashion. A surgical timeout was performed indicating the correct patient, procedure, positioning and need for preoperative antibiotics. A paraumbilical incision was made and carried down into the subcutaneous tissue. This was carried down to the fascia using electrocautery. The fascia was identified. The hernia contained no sac, and contents are preperitoneal and omental fat. The hernia was then circumferentially dissected. The umbilical stalk was freed of attachments to the hernia. The stalk was involved as this was a true umbilical hernia. The hernia contents were questionably viable after mobilzing free and therefore ligated and divided preperitoneal fat/omentum. Defect measures 1 x 1 cm in size. The fascial edges were cleared of overlying subcutaneous tissue.  They are quite relaxed and easily overlap 1 another without any tension.  It was therefore felt  that a primary repair would be ideal in this situation and resulted in good repair.  The fascial defect was then approximated with interrupted 0 Ethibond sutures. A mixture of Exparel and 0.25% marcaine was infiltrated into the field. The wound was irrigated with sterile saline and hemostasis verified. The umbilicus was then recreated by suturing the dermis of the umbilical skin to the fascia with a 3-0 vicryl suture. Deep dermal sutures of 3-0 vicryl were placed.  All sponge, needle, and instrument counts were reported correct.  The skin was then closed with a running 4-0 subcuticular suture. The wound is washed and dried. The incision was covered with dermabond. A piece of sterile gauze and Tegaderm was then placed over this. He was then awakened from general anesthesia and transferred to a stretcher for transport to PACU in satisfactory condition.

## 2023-04-26 NOTE — Anesthesia Procedure Notes (Signed)
Procedure Name: Intubation Date/Time: 04/26/2023 7:35 AM  Performed by: Irving Burton, CRNAPre-anesthesia Checklist: Patient identified, Patient being monitored, Timeout performed, Emergency Drugs available and Suction available Patient Re-evaluated:Patient Re-evaluated prior to induction Oxygen Delivery Method: Circle system utilized Preoxygenation: Pre-oxygenation with 100% oxygen Induction Type: IV induction Ventilation: Mask ventilation without difficulty Laryngoscope Size: Mac and 4 Grade View: Grade II Tube type: Oral Tube size: 7.5 mm Number of attempts: 1 Airway Equipment and Method: Stylet Placement Confirmation: ETT inserted through vocal cords under direct vision, positive ETCO2 and breath sounds checked- equal and bilateral Secured at: 23 cm Tube secured with: Tape Dental Injury: Teeth and Oropharynx as per pre-operative assessment

## 2023-04-26 NOTE — Anesthesia Preprocedure Evaluation (Addendum)
Anesthesia Evaluation  Patient identified by MRN, date of birth, ID band Patient awake    Reviewed: Allergy & Precautions, H&P , NPO status , Patient's Chart, lab work & pertinent test results  Airway Mallampati: II  TM Distance: >3 FB Neck ROM: Full    Dental no notable dental hx.    Pulmonary  Snores at night, has not had sleep study    Pulmonary exam normal breath sounds clear to auscultation       Cardiovascular negative cardio ROS Normal cardiovascular exam Rhythm:Regular Rate:Normal     Neuro/Psych negative neurological ROS  negative psych ROS   GI/Hepatic negative GI ROS, Neg liver ROS,,,  Endo/Other  negative endocrine ROS    Renal/GU negative Renal ROS  negative genitourinary   Musculoskeletal  (+) Arthritis , Osteoarthritis,    Abdominal   Peds negative pediatric ROS (+)  Hematology negative hematology ROS (+) Hb 15.7   Anesthesia Other Findings   Reproductive/Obstetrics negative OB ROS                             Anesthesia Physical Anesthesia Plan  ASA: 2  Anesthesia Plan: General   Post-op Pain Management: Tylenol PO (pre-op)*   Induction: Intravenous  PONV Risk Score and Plan: Ondansetron, Dexamethasone and Treatment may vary due to age or medical condition  Airway Management Planned: Oral ETT  Additional Equipment: None  Intra-op Plan:   Post-operative Plan: Extubation in OR  Informed Consent: I have reviewed the patients History and Physical, chart, labs and discussed the procedure including the risks, benefits and alternatives for the proposed anesthesia with the patient or authorized representative who has indicated his/her understanding and acceptance.     Dental advisory given  Plan Discussed with: CRNA  Anesthesia Plan Comments:        Anesthesia Quick Evaluation

## 2023-04-26 NOTE — Discharge Instructions (Addendum)
POST OP INSTRUCTIONS  DIET: As tolerated. Follow a light bland diet the first 24 hours after arrival home, such as soup, liquids, crackers, etc.  Be sure to include lots of fluids daily.  Avoid fast food or heavy meals as your are more likely to get nauseated.  Eat a low fat the next few days after surgery.  Take your usually prescribed home medications unless otherwise directed.  PAIN CONTROL: Pain is best controlled by a usual combination of three different methods TOGETHER: Ice/Heat Over the counter pain medication Prescription pain medication Most patients will experience some swelling and bruising around the surgical site.  Ice packs or heating pads (30-60 minutes up to 6 times a day) will help. Some people prefer to use ice alone, heat alone, alternating between ice & heat.  Experiment to what works for you.  Swelling and bruising can take several weeks to resolve.   It is helpful to take an over-the-counter pain medication regularly for the first few weeks: Ibuprofen (Motrin/Advil) - 200mg  tabs - take 3 tabs (600mg ) every 6 hours as needed for pain Acetaminophen (Tylenol) - you may take 650mg  every 6 hours as needed. You can take this with motrin as they act differently on the body. If you are taking a narcotic pain medication that has acetaminophen in it, do not take over the counter tylenol at the same time.  Iii. NOTE: You may take both of these medications together - most patients  find it most helpful when alternating between the two (i.e. Ibuprofen at 6am,  tylenol at 9am, ibuprofen at 12pm ..Marland Kitchen) A  prescription for pain medication should be given to you upon discharge.  Take your pain medication as prescribed if your pain is not adequatly controlled with the over-the-counter pain reliefs mentioned above.  Avoid getting constipated.  Between the surgery and the pain medications, it is common to experience some constipation.  Increasing fluid intake and taking a fiber supplement (such as  Metamucil, Citrucel, FiberCon, MiraLax, etc) 1-2 times a day regularly will usually help prevent this problem from occurring.  A mild laxative (prune juice, Milk of Magnesia, MiraLax, etc) should be taken according to package directions if there are no bowel movements after 48 hours.    Dressing: Your incision is covered in Dermabond which is like sterile superglue for the skin. This will come off on it's own in a couple weeks. It is waterproof and you may bathe normally starting the day after your surgery in a shower. Avoid baths/pools/lakes/oceans until your wounds have fully healed. The dressing over your incision is also water proof and you may bathe normally with this on. Remove this dressing on 04/29/23.  ACTIVITIES as tolerated:   Avoid heavy lifting (>10lbs or 1 gallon of milk) for the next 6 weeks. You may resume regular (light) daily activities beginning the next day--such as daily self-care, walking, climbing stairs--gradually increasing activities as tolerated.  If you can walk 30 minutes without difficulty, it is safe to try more intense activity such as jogging, treadmill, bicycling, low-impact aerobics.  DO NOT PUSH THROUGH PAIN.  Let pain be your guide: If it hurts to do something, don't do it. You may drive when you are no longer taking prescription pain medication, you can comfortably wear a seatbelt, and you can safely maneuver your car and apply brakes.   FOLLOW UP in our office Please call [facility] at [phone] to set up an appointment to see your surgeon in the office for a  follow-up appointment approximately 2 weeks after your surgery. Make sure that you call for this appointment the day you arrive home to insure a convenient appointment time.  9. If you have disability or family leave forms that need to be completed, you may have them completed by your primary care physician's office; for return to work instructions, please ask our office staff and they will be happy to assist  you in obtaining this documentation   When to call us [phone]: Poor pain control Reactions / problems with new medications (rash/itching, etc)  Fever over 101.5 F (38.5 C) Inability to urinate Nausea/vomiting Worsening swelling or bruising Continued bleeding from incision. Increased pain, redness, or drainage from the incision  The clinic staff is available to answer your questions during regular business hours (8:30am-5pm).  Please don't hesitate to call and ask to speak to one of our nurses for clinical concerns.   A surgeon from [facility] is always on call at the hospitals   If you have a medical emergency, go to the nearest emergency room or call 911.  [facility] [address], [address], [place], [place]  [address] MAIN: [phone] FAX: [phone] [email or web address]

## 2023-04-26 NOTE — H&P (Signed)
CC: Here today for surgery  HPI: Angel Boyd is an 77 y.o. male otherwise healthy, whom is seen in the office today as a referral by Dr. Docia Boyd for evaluation of umbilical hernia.  He has noticed a bulge in the umbilical position just above the umbilical stalk that has been present for the last few years. It has become increasingly more noticeable and symptomatic. He reports discomfort particular towards the end of the day at his umbilicus. He has previously been able to reduce this in the standing position but over the last couple of months has had to lie flat in order to reduce the hernia. When he does reduce that he notes improvement in his symptoms.   Currently, denies any abdominal pain, nausea or vomiting. Denies any history of any abdominal or pelvic surgical history including any incisions around the umbilicus.  PMH: Erectile dysfunction, otherwise, denies any other health issues  PSH: Denies any prior abdominal or pelvic surgical history; right knee replacement, 2016-Dr. Lucey  FHx: Denies any known family history of colorectal, breast, endometrial or ovarian cancer  Social Hx: Denies use of tobacco/EtOH/illicit drug. He is happily retired x 15 years, previously working as a Comptroller   He denies any changes in health or health history since we met in the office. No new medications/allergies. He states he is ready for surgery today.  Past Medical History:  Diagnosis Date   Arthritis    Basal cell carcinoma    Cancer (HCC)    skin cancer removed   Hearing loss    Snoring    Urticaria     Past Surgical History:  Procedure Laterality Date   COLONOSCOPY W/ POLYPECTOMY     SKIN CANCER EXCISION     neck and head   TOTAL KNEE ARTHROPLASTY Right 10/29/2014   Procedure: TOTAL KNEE ARTHROPLASTY;  Surgeon: Angel Huh, MD;  Location: [facility] OR;  Service: Orthopedics;  Laterality: Right;    History reviewed. No pertinent family history.  Social:  reports that he has  never smoked. He has never used smokeless tobacco. He reports current alcohol use. He reports that he does not use drugs.  Allergies:  Allergies  Allergen Reactions   Ampicillin Hives   Azithromycin Hives, Itching and Swelling   Penicillin G Rash    HIVES    Medications: I have reviewed the patient's current medications.  No results found for this or any previous visit (from the past 48 hour(s)).  No results found.   PE Blood pressure (!) 151/81, pulse (!) 55, temperature 98.9 F (37.2 C), temperature source Oral, resp. rate 18, height 5' 10.5" (1.791 m), weight 97 kg, SpO2 96%. Constitutional: NAD; conversant Eyes: Moist conjunctiva; no lid lag; anicteric Lungs: Normal respiratory effort CV: RRR GI: Abd Abdomen is soft, nontender, nondistended; umbilical buldge just cephalad to umbilicus ~1 - 2 cm in size and reducible, no overlying skin changes; no palpable hepatosplenomegaly Psychiatric: Appropriate affect  No results found for this or any previous visit (from the past 48 hour(s)).  No results found.  A/P: Angel Boyd is an 77 y.o. male otherwise healthy here for evaluation of symptomatic reducible umbilical hernia  -The anatomy and physiology of the GI tract and abdominal wall was reviewed with thim with associated illustrations. The pathophysiology of hernias was covered as well. -The options for treatment were reviewed including further observation (with risk of worsening pain, incarceration of hernia, potential need for urgent/emergent procedures) vs surgery - open umbilical hernia  repair with possible mesh  -We discussed primary repair versus mesh repair based on intraoperative findings including the size of the defect. We did spend time discussing expectations therein with each approach.  -The planned procedure, material risks (including, but not limited to, pain, bleeding, infection, scarring, need for blood transfusion, damage to surrounding structures-blood  vessels/nerves/viscus/organs, need for additional procedures, recurrence, chronic pain, mesh complications including infection or erosion into other structures/vessels/organs/viscus, worsening of pre-existing medical conditions, pneumonia, heart attack, stroke, death) benefits and alternatives to surgery were discussed at length. I noted a good probability that the procedure help improve his symptoms. The patient's questions were answered to his satisfaction, he voiced understanding and they elected to proceed with surgery. Additionally, we discussed typical postoperative expectations and the recovery process.   Angel Olp, MD [facility]

## 2023-04-26 NOTE — Anesthesia Postprocedure Evaluation (Signed)
Anesthesia Post Note  Patient: Angel Boyd  Procedure(s) Performed: OPEN PRIMARY HERNIA REPAIR UMBILICAL     Patient location during evaluation: PACU Anesthesia Type: General Level of consciousness: awake and alert, oriented and patient cooperative Pain management: pain level controlled Vital Signs Assessment: post-procedure vital signs reviewed and stable Respiratory status: spontaneous breathing, nonlabored ventilation and respiratory function stable Cardiovascular status: blood pressure returned to baseline and stable Postop Assessment: no apparent nausea or vomiting Anesthetic complications: no   No notable events documented.  Last Vitals:  Vitals:   04/26/23 0900 04/26/23 0915  BP: (!) 156/80 (!) 151/83  Pulse: (!) 59 (!) 57  Resp: 14 18  Temp:  (!) 36.3 C  SpO2: 95% 98%    Last Pain:  Vitals:   04/26/23 0915  TempSrc:   PainSc: 0-No pain                 Lannie Fields

## 2023-04-26 NOTE — Transfer of Care (Signed)
Immediate Anesthesia Transfer of Care Note  Patient: Angel Boyd  Procedure(s) Performed: OPEN PRIMARY HERNIA REPAIR UMBILICAL  Patient Location: PACU  Anesthesia Type:General  Level of Consciousness: awake  Airway & Oxygen Therapy: Patient Spontanous Breathing and Patient connected to face mask oxygen  Post-op Assessment: Report given to RN and Post -op Vital signs reviewed and stable  Post vital signs: Reviewed and stable  Last Vitals:  Vitals Value Taken Time  BP 153/72 04/26/23 0838  Temp    Pulse 61 04/26/23 0840  Resp 13 04/26/23 0840  SpO2 100 % 04/26/23 0840  Vitals shown include unfiled device data.  Last Pain:  Vitals:   04/26/23 0604  TempSrc:   PainSc: 0-No pain      Patients Stated Pain Goal: 4 (04/26/23 0604)  Complications: No notable events documented.

## 2023-04-27 ENCOUNTER — Encounter (HOSPITAL_COMMUNITY): Payer: Self-pay | Admitting: Surgery

## 2023-05-06 DIAGNOSIS — L570 Actinic keratosis: Secondary | ICD-10-CM | POA: Diagnosis not present

## 2023-05-06 DIAGNOSIS — L820 Inflamed seborrheic keratosis: Secondary | ICD-10-CM | POA: Diagnosis not present

## 2023-05-06 DIAGNOSIS — D044 Carcinoma in situ of skin of scalp and neck: Secondary | ICD-10-CM | POA: Diagnosis not present

## 2023-05-06 DIAGNOSIS — Z85828 Personal history of other malignant neoplasm of skin: Secondary | ICD-10-CM | POA: Diagnosis not present

## 2023-05-06 DIAGNOSIS — D2272 Melanocytic nevi of left lower limb, including hip: Secondary | ICD-10-CM | POA: Diagnosis not present

## 2023-05-06 DIAGNOSIS — L821 Other seborrheic keratosis: Secondary | ICD-10-CM | POA: Diagnosis not present

## 2023-05-06 DIAGNOSIS — D1801 Hemangioma of skin and subcutaneous tissue: Secondary | ICD-10-CM | POA: Diagnosis not present

## 2023-05-06 DIAGNOSIS — L814 Other melanin hyperpigmentation: Secondary | ICD-10-CM | POA: Diagnosis not present

## 2023-05-18 DIAGNOSIS — Z09 Encounter for follow-up examination after completed treatment for conditions other than malignant neoplasm: Secondary | ICD-10-CM | POA: Diagnosis not present

## 2023-06-14 DIAGNOSIS — M9901 Segmental and somatic dysfunction of cervical region: Secondary | ICD-10-CM | POA: Diagnosis not present

## 2023-06-14 DIAGNOSIS — M9905 Segmental and somatic dysfunction of pelvic region: Secondary | ICD-10-CM | POA: Diagnosis not present

## 2023-06-14 DIAGNOSIS — M9902 Segmental and somatic dysfunction of thoracic region: Secondary | ICD-10-CM | POA: Diagnosis not present

## 2023-06-14 DIAGNOSIS — M9903 Segmental and somatic dysfunction of lumbar region: Secondary | ICD-10-CM | POA: Diagnosis not present

## 2023-06-21 DIAGNOSIS — H35722 Serous detachment of retinal pigment epithelium, left eye: Secondary | ICD-10-CM | POA: Diagnosis not present

## 2023-06-21 DIAGNOSIS — H43823 Vitreomacular adhesion, bilateral: Secondary | ICD-10-CM | POA: Diagnosis not present

## 2023-06-21 DIAGNOSIS — M9902 Segmental and somatic dysfunction of thoracic region: Secondary | ICD-10-CM | POA: Diagnosis not present

## 2023-06-21 DIAGNOSIS — M9905 Segmental and somatic dysfunction of pelvic region: Secondary | ICD-10-CM | POA: Diagnosis not present

## 2023-06-21 DIAGNOSIS — H353131 Nonexudative age-related macular degeneration, bilateral, early dry stage: Secondary | ICD-10-CM | POA: Diagnosis not present

## 2023-06-21 DIAGNOSIS — M9901 Segmental and somatic dysfunction of cervical region: Secondary | ICD-10-CM | POA: Diagnosis not present

## 2023-06-21 DIAGNOSIS — M9903 Segmental and somatic dysfunction of lumbar region: Secondary | ICD-10-CM | POA: Diagnosis not present

## 2023-06-21 DIAGNOSIS — H2513 Age-related nuclear cataract, bilateral: Secondary | ICD-10-CM | POA: Diagnosis not present

## 2023-07-05 DIAGNOSIS — M9905 Segmental and somatic dysfunction of pelvic region: Secondary | ICD-10-CM | POA: Diagnosis not present

## 2023-07-05 DIAGNOSIS — M9903 Segmental and somatic dysfunction of lumbar region: Secondary | ICD-10-CM | POA: Diagnosis not present

## 2023-07-05 DIAGNOSIS — M9902 Segmental and somatic dysfunction of thoracic region: Secondary | ICD-10-CM | POA: Diagnosis not present

## 2023-07-05 DIAGNOSIS — M9901 Segmental and somatic dysfunction of cervical region: Secondary | ICD-10-CM | POA: Diagnosis not present

## 2023-08-16 DIAGNOSIS — D175 Benign lipomatous neoplasm of intra-abdominal organs: Secondary | ICD-10-CM | POA: Diagnosis not present

## 2023-08-16 DIAGNOSIS — Z860100 Personal history of colon polyps, unspecified: Secondary | ICD-10-CM | POA: Diagnosis not present

## 2023-08-16 DIAGNOSIS — D123 Benign neoplasm of transverse colon: Secondary | ICD-10-CM | POA: Diagnosis not present

## 2023-08-16 DIAGNOSIS — Z09 Encounter for follow-up examination after completed treatment for conditions other than malignant neoplasm: Secondary | ICD-10-CM | POA: Diagnosis not present

## 2023-08-18 DIAGNOSIS — D123 Benign neoplasm of transverse colon: Secondary | ICD-10-CM | POA: Diagnosis not present

## 2023-09-02 DIAGNOSIS — Z0001 Encounter for general adult medical examination with abnormal findings: Secondary | ICD-10-CM | POA: Diagnosis not present

## 2023-09-02 DIAGNOSIS — E7800 Pure hypercholesterolemia, unspecified: Secondary | ICD-10-CM | POA: Diagnosis not present

## 2023-09-02 DIAGNOSIS — Z79899 Other long term (current) drug therapy: Secondary | ICD-10-CM | POA: Diagnosis not present

## 2024-01-27 DIAGNOSIS — H2512 Age-related nuclear cataract, left eye: Secondary | ICD-10-CM | POA: Diagnosis not present

## 2024-01-27 DIAGNOSIS — H25043 Posterior subcapsular polar age-related cataract, bilateral: Secondary | ICD-10-CM | POA: Diagnosis not present

## 2024-01-27 DIAGNOSIS — H18413 Arcus senilis, bilateral: Secondary | ICD-10-CM | POA: Diagnosis not present

## 2024-01-27 DIAGNOSIS — H35712 Central serous chorioretinopathy, left eye: Secondary | ICD-10-CM | POA: Diagnosis not present

## 2024-01-27 DIAGNOSIS — H2513 Age-related nuclear cataract, bilateral: Secondary | ICD-10-CM | POA: Diagnosis not present

## 2024-02-25 DIAGNOSIS — H6090 Unspecified otitis externa, unspecified ear: Secondary | ICD-10-CM | POA: Diagnosis not present

## 2024-03-15 DIAGNOSIS — H2512 Age-related nuclear cataract, left eye: Secondary | ICD-10-CM | POA: Diagnosis not present

## 2024-03-16 DIAGNOSIS — H2511 Age-related nuclear cataract, right eye: Secondary | ICD-10-CM | POA: Diagnosis not present

## 2024-03-29 DIAGNOSIS — H2511 Age-related nuclear cataract, right eye: Secondary | ICD-10-CM | POA: Diagnosis not present

## 2024-05-08 DIAGNOSIS — L814 Other melanin hyperpigmentation: Secondary | ICD-10-CM | POA: Diagnosis not present

## 2024-05-08 DIAGNOSIS — L820 Inflamed seborrheic keratosis: Secondary | ICD-10-CM | POA: Diagnosis not present

## 2024-05-08 DIAGNOSIS — Z85828 Personal history of other malignant neoplasm of skin: Secondary | ICD-10-CM | POA: Diagnosis not present

## 2024-05-08 DIAGNOSIS — L821 Other seborrheic keratosis: Secondary | ICD-10-CM | POA: Diagnosis not present

## 2024-05-08 DIAGNOSIS — D1801 Hemangioma of skin and subcutaneous tissue: Secondary | ICD-10-CM | POA: Diagnosis not present

## 2024-05-08 DIAGNOSIS — D485 Neoplasm of uncertain behavior of skin: Secondary | ICD-10-CM | POA: Diagnosis not present

## 2024-05-08 DIAGNOSIS — L720 Epidermal cyst: Secondary | ICD-10-CM | POA: Diagnosis not present

## 2024-05-08 DIAGNOSIS — C44622 Squamous cell carcinoma of skin of right upper limb, including shoulder: Secondary | ICD-10-CM | POA: Diagnosis not present

## 2024-05-08 DIAGNOSIS — L570 Actinic keratosis: Secondary | ICD-10-CM | POA: Diagnosis not present

## 2024-05-08 DIAGNOSIS — B078 Other viral warts: Secondary | ICD-10-CM | POA: Diagnosis not present
